# Patient Record
Sex: Male | Born: 1950 | Race: White | Hispanic: No | Marital: Single | Smoking: Former smoker
Health system: Southern US, Community
[De-identification: ages and names within clinical notes are randomized; demographics above are authoritative.]

## PROBLEM LIST (undated history)

## (undated) DIAGNOSIS — I00 Rheumatic fever without heart involvement: Secondary | ICD-10-CM

## (undated) DIAGNOSIS — K5730 Diverticulosis of large intestine without perforation or abscess without bleeding: Secondary | ICD-10-CM

## (undated) DIAGNOSIS — H9190 Unspecified hearing loss, unspecified ear: Secondary | ICD-10-CM

## (undated) DIAGNOSIS — C4491 Basal cell carcinoma of skin, unspecified: Secondary | ICD-10-CM

## (undated) DIAGNOSIS — E785 Hyperlipidemia, unspecified: Secondary | ICD-10-CM

## (undated) DIAGNOSIS — I442 Atrioventricular block, complete: Secondary | ICD-10-CM

## (undated) DIAGNOSIS — I517 Cardiomegaly: Secondary | ICD-10-CM

## (undated) DIAGNOSIS — Z86010 Personal history of colonic polyps: Secondary | ICD-10-CM

## (undated) DIAGNOSIS — Z860101 Personal history of adenomatous and serrated colon polyps: Secondary | ICD-10-CM

## (undated) HISTORY — PX: OTHER SURGICAL HISTORY: SHX169

## (undated) HISTORY — DX: Unspecified hearing loss, unspecified ear: H91.90

## (undated) HISTORY — DX: Hyperlipidemia, unspecified: E78.5

## (undated) HISTORY — DX: Rheumatic fever without heart involvement: I00

## (undated) HISTORY — DX: Cardiomegaly: I51.7

## (undated) HISTORY — DX: Basal cell carcinoma of skin, unspecified: C44.91

## (undated) HISTORY — DX: Atrioventricular block, complete: I44.2

## (undated) HISTORY — DX: Personal history of adenomatous and serrated colon polyps: Z86.0101

## (undated) HISTORY — DX: Diverticulosis of large intestine without perforation or abscess without bleeding: K57.30

## (undated) HISTORY — DX: Personal history of colonic polyps: Z86.010

---

## 2000-08-07 ENCOUNTER — Ambulatory Visit (HOSPITAL_COMMUNITY): Admission: RE | Admit: 2000-08-07 | Discharge: 2000-08-07 | Payer: Self-pay | Admitting: Gastroenterology

## 2000-08-07 ENCOUNTER — Encounter (INDEPENDENT_AMBULATORY_CARE_PROVIDER_SITE_OTHER): Payer: Self-pay | Admitting: Specialist

## 2009-03-23 ENCOUNTER — Encounter: Admission: RE | Admit: 2009-03-23 | Discharge: 2009-03-23 | Payer: Self-pay | Admitting: Gastroenterology

## 2009-09-22 DIAGNOSIS — I517 Cardiomegaly: Secondary | ICD-10-CM

## 2009-09-22 HISTORY — DX: Cardiomegaly: I51.7

## 2010-03-29 ENCOUNTER — Ambulatory Visit: Payer: Self-pay | Admitting: Oncology

## 2010-05-28 ENCOUNTER — Ambulatory Visit: Payer: Self-pay | Admitting: Oncology

## 2010-05-29 LAB — CBC & DIFF AND RETIC
BASO%: 0.7 % (ref 0.0–2.0)
Basophils Absolute: 0 10*3/uL (ref 0.0–0.1)
HCT: 37.4 % — ABNORMAL LOW (ref 38.4–49.9)
HGB: 12.2 g/dL — ABNORMAL LOW (ref 13.0–17.1)
Immature Retic Fract: 7.9 % (ref 0.00–13.40)
MCHC: 32.6 g/dL (ref 32.0–36.0)
MONO#: 0.6 10*3/uL (ref 0.1–0.9)
NEUT#: 3.3 10*3/uL (ref 1.5–6.5)
NEUT%: 56.7 % (ref 39.0–75.0)
WBC: 5.9 10*3/uL (ref 4.0–10.3)
lymph#: 1.8 10*3/uL (ref 0.9–3.3)

## 2010-05-29 LAB — CHCC SMEAR

## 2010-05-31 LAB — PROTEIN ELECTROPHORESIS, SERUM
Alpha-2-Globulin: 10.6 % (ref 7.1–11.8)
Beta 2: 4 % (ref 3.2–6.5)
Beta Globulin: 5.9 % (ref 4.7–7.2)
Gamma Globulin: 11.5 % (ref 11.1–18.8)

## 2010-05-31 LAB — IRON AND TIBC
%SAT: 20 % (ref 20–55)
TIBC: 297 ug/dL (ref 215–435)
UIBC: 239 ug/dL

## 2010-05-31 LAB — COMPREHENSIVE METABOLIC PANEL
ALT: 20 U/L (ref 0–53)
CO2: 27 mEq/L (ref 19–32)
Sodium: 140 mEq/L (ref 135–145)
Total Bilirubin: 0.5 mg/dL (ref 0.3–1.2)
Total Protein: 6.5 g/dL (ref 6.0–8.3)

## 2010-05-31 LAB — FERRITIN: Ferritin: 113 ng/mL (ref 22–322)

## 2010-05-31 LAB — FOLATE: Folate: >20 ng/mL

## 2010-05-31 LAB — HEPATITIS C ANTIBODY: HCV Ab: NEGATIVE

## 2010-05-31 LAB — HEPATITIS B CORE ANTIBODY, IGM: Hep B C IgM: NEGATIVE

## 2010-05-31 LAB — LACTATE DEHYDROGENASE: LDH: 186 U/L (ref 94–250)

## 2010-08-26 ENCOUNTER — Ambulatory Visit: Payer: Self-pay | Admitting: Oncology

## 2010-08-28 LAB — CBC WITH DIFFERENTIAL/PLATELET
Basophils Absolute: 0 10*3/uL (ref 0.0–0.1)
EOS%: 1 % (ref 0.0–7.0)
Eosinophils Absolute: 0.1 10*3/uL (ref 0.0–0.5)
HGB: 13.6 g/dL (ref 13.0–17.1)
NEUT#: 3.1 10*3/uL (ref 1.5–6.5)
RBC: 4.61 10*6/uL (ref 4.20–5.82)
RDW: 15.8 % — ABNORMAL HIGH (ref 11.0–14.6)
lymph#: 1.9 10*3/uL (ref 0.9–3.3)

## 2010-12-09 ENCOUNTER — Encounter (HOSPITAL_BASED_OUTPATIENT_CLINIC_OR_DEPARTMENT_OTHER): Payer: 59 | Admitting: Oncology

## 2010-12-09 ENCOUNTER — Other Ambulatory Visit: Payer: Self-pay | Admitting: Oncology

## 2010-12-09 DIAGNOSIS — E039 Hypothyroidism, unspecified: Secondary | ICD-10-CM

## 2010-12-09 DIAGNOSIS — D649 Anemia, unspecified: Secondary | ICD-10-CM

## 2010-12-09 DIAGNOSIS — Z85828 Personal history of other malignant neoplasm of skin: Secondary | ICD-10-CM

## 2010-12-09 DIAGNOSIS — E785 Hyperlipidemia, unspecified: Secondary | ICD-10-CM

## 2010-12-09 DIAGNOSIS — D696 Thrombocytopenia, unspecified: Secondary | ICD-10-CM

## 2010-12-09 LAB — CBC WITH DIFFERENTIAL/PLATELET
Basophils Absolute: 0 10*3/uL (ref 0.0–0.1)
Eosinophils Absolute: 0 10*3/uL (ref 0.0–0.5)
HGB: 14.1 g/dL (ref 13.0–17.1)
MCV: 89.6 fL (ref 79.3–98.0)
MONO#: 0.4 10*3/uL (ref 0.1–0.9)
MONO%: 8 % (ref 0.0–14.0)
NEUT#: 3.3 10*3/uL (ref 1.5–6.5)
Platelets: 182 10*3/uL (ref 140–400)
RDW: 16 % — ABNORMAL HIGH (ref 11.0–14.6)
WBC: 5.5 10*3/uL (ref 4.0–10.3)

## 2011-02-26 ENCOUNTER — Encounter (HOSPITAL_BASED_OUTPATIENT_CLINIC_OR_DEPARTMENT_OTHER): Payer: 59 | Admitting: Oncology

## 2011-02-26 ENCOUNTER — Other Ambulatory Visit: Payer: Self-pay | Admitting: Medical

## 2011-02-26 DIAGNOSIS — E785 Hyperlipidemia, unspecified: Secondary | ICD-10-CM

## 2011-02-26 DIAGNOSIS — E039 Hypothyroidism, unspecified: Secondary | ICD-10-CM

## 2011-02-26 DIAGNOSIS — Z85828 Personal history of other malignant neoplasm of skin: Secondary | ICD-10-CM

## 2011-02-26 DIAGNOSIS — D649 Anemia, unspecified: Secondary | ICD-10-CM

## 2011-02-26 LAB — CBC WITH DIFFERENTIAL/PLATELET
Basophils Absolute: 0 10*3/uL (ref 0.0–0.1)
Eosinophils Absolute: 0.1 10*3/uL (ref 0.0–0.5)
HCT: 40.4 % (ref 38.4–49.9)
LYMPH%: 30.7 % (ref 14.0–49.0)
MCV: 92.5 fL (ref 79.3–98.0)
MONO#: 0.7 10*3/uL (ref 0.1–0.9)
MONO%: 10.1 % (ref 0.0–14.0)
NEUT#: 3.9 10*3/uL (ref 1.5–6.5)
NEUT%: 57.7 % (ref 39.0–75.0)
Platelets: 198 10*3/uL (ref 140–400)
RBC: 4.37 10*6/uL (ref 4.20–5.82)

## 2011-06-11 ENCOUNTER — Other Ambulatory Visit: Payer: Self-pay | Admitting: Medical

## 2011-06-11 ENCOUNTER — Encounter (HOSPITAL_BASED_OUTPATIENT_CLINIC_OR_DEPARTMENT_OTHER): Payer: 59 | Admitting: Oncology

## 2011-06-11 DIAGNOSIS — E039 Hypothyroidism, unspecified: Secondary | ICD-10-CM

## 2011-06-11 DIAGNOSIS — D649 Anemia, unspecified: Secondary | ICD-10-CM

## 2011-06-11 DIAGNOSIS — Z85828 Personal history of other malignant neoplasm of skin: Secondary | ICD-10-CM

## 2011-06-11 DIAGNOSIS — E785 Hyperlipidemia, unspecified: Secondary | ICD-10-CM

## 2011-06-11 LAB — CBC WITH DIFFERENTIAL/PLATELET
BASO%: 0.6 % (ref 0.0–2.0)
HCT: 44.1 % (ref 38.4–49.9)
LYMPH%: 31.1 % (ref 14.0–49.0)
MCH: 31.7 pg (ref 27.2–33.4)
MCHC: 34.8 g/dL (ref 32.0–36.0)
MCV: 91.2 fL (ref 79.3–98.0)
MONO%: 7.8 % (ref 0.0–14.0)
NEUT%: 59.4 % (ref 39.0–75.0)
Platelets: 160 10*3/uL (ref 140–400)
RBC: 4.84 10*6/uL (ref 4.20–5.82)
WBC: 6.8 10*3/uL (ref 4.0–10.3)

## 2012-07-09 ENCOUNTER — Encounter: Payer: Self-pay | Admitting: Cardiology

## 2012-07-12 ENCOUNTER — Encounter: Payer: Self-pay | Admitting: Internal Medicine

## 2012-07-12 ENCOUNTER — Ambulatory Visit (INDEPENDENT_AMBULATORY_CARE_PROVIDER_SITE_OTHER): Payer: 59 | Admitting: Internal Medicine

## 2012-07-12 VITALS — BP 129/80 | HR 36 | Ht 72.0 in | Wt 184.1 lb

## 2012-07-12 DIAGNOSIS — I498 Other specified cardiac arrhythmias: Secondary | ICD-10-CM

## 2012-07-12 DIAGNOSIS — I442 Atrioventricular block, complete: Secondary | ICD-10-CM

## 2012-07-12 DIAGNOSIS — R001 Bradycardia, unspecified: Secondary | ICD-10-CM

## 2012-07-12 NOTE — Assessment & Plan Note (Signed)
The patient has complete heart block and heart rates in the 30s. This is a class I indication for pacing. This is based on the uncertainty of stability of the escape rate and unrelated to   chronotropic competence which it sounds like he has maintained as well as his exercise capacity.  It is appropriate to consider pacing. We have reviewed benefits and risks. It was appropriate sinus function, pacemaker is pacing would be sufficient and longevity of device would be the criteria in deciding.  The other issue relates to potential causes. It is I think important to exclude sarcoid which is probably best done with cardiac MR we will schedule that. I will review with Dr. Anne Fu if any blood testing has been done for things such as Lyme disease or amyloid.

## 2012-07-12 NOTE — Patient Instructions (Signed)
Your physician has requested that you have a cardiac MRI. Cardiac MRI uses a computer to create images of your heart as its beating, producing both still and moving pictures of your heart and major blood vessels. For further information please visit [email or web address]. Please follow the instruction sheet given to you today for more information.  We will be in touch with you following your Cardiac MRI.

## 2012-07-12 NOTE — Progress Notes (Signed)
ELECTROPHYSIOLOGY CONSULT NOTE  Patient ID: Calvin [name], MRN: [id number], DOB/AGE: Aug 04, 1951 61 y.o. 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 This appears to be asymptomatic. He has chronotropic competence at least partially with a heart rate of 140 on treadmill testing just last week which would be approximately 85% of his predicted maximum. He runs daily. He has only post exercise lightheadedness. He has no shortness of breath.  Cardiac evaluation has included an echocardiogram demonstrating left ventricular hypertrophy. No further testing has been done of which he is aware looking for infiltrative processes in his heart. Past Medical History  Diagnosis Date  . AV block, 1st degree   . AV block, 2nd degree   . Ejection fraction < 50%     60-65%  . LVH (left ventricular hypertrophy) 2011    concentric, on echocardiogram   . Hyperlipidemia   . Rheumatic fever   . High frequency hearing loss   . Skin cancer, basal cell   . Diverticulosis of colon     Left, colonic  . Hx of adenomatous colonic polyps   . Condition not found     Abnormal liver transaminases      Surgical History:  Past Surgical History  Procedure Date  . Basal cell skin cancer surgery   . Arthroscopic knee surgery      Home Meds: Prior to Admission medications   Medication Sig Start Date End Date Taking? Authorizing Provider  B Complex Vitamins (B COMPLEX 100 PO) Take 100 mg by mouth daily.   Yes Historical Provider, MD  levothyroxine (SYNTHROID, LEVOTHROID) 100 MCG tablet Take 100 mcg by mouth daily.   Yes Historical Provider, MD  vitamin E 400 UNIT capsule Take 400 Units by mouth 2 (two) times daily.   Yes Historical Provider, MD     Allergies: No Known  Allergies  History   Social History  . Marital Status: Married    Spouse Name: N/A    Number of Children: N/A  . Years of Education: N/A   Occupational History  . Not on file.   Social History Main Topics  . Smoking status: Former Games developer  . Smokeless tobacco: Not on file  . Alcohol Use:   . Drug Use:   . Sexually Active:    Other Topics Concern  . Not on file   Social History Narrative  . No narrative on file     Family History  Problem Relation Age of Onset  . Bone cancer Mother     Polio  . Hyperlipidemia Father      ROS:  Please see the history of present illness.     All other systems reviewed and negative.    Physical Exam: Blood pressure 129/80, pulse 36, height 6' (1.829 m), weight 184 lb 1.9 oz (83.516 kg). General: Well developed, well nourished male in no acute distress. Head: Normocephalic, atraumatic, sclera non-icteric, no xanthomas, nares are without discharge. Lymph Nodes:  none Back: without scoliosis/kyphosis, no CVA tendersness Neck: Negative for carotid bruits. JVD not elevated. Lungs: Clear bilaterally to auscultation without wheezes, rales, or rhonchi. Breathing is unlabored. Heart: Slow but regular rhythm with variable S1 and normally split S2 an early 2/6 systolic murmur  , rubs, or gallops appreciated. Abdomen:  Soft, non-tender, non-distended with normoactive bowel sounds. No hepatomegaly. No rebound/guarding. No obvious abdominal masses. Msk:  Strength and tone appear normal for age. Extremities: No clubbing or cyanosis. No  edema.  Distal pedal pulses are 2+ and equal bilaterally. Skin: Warm and Dry Neuro: Alert and oriented X 3. CN III-XII intact Grossly normal sensory and motor function . Psych:  Responds to questions appropriately with a normal affect.      Labs: Cardiac Enzymes No results found for this basename: CKTOTAL:4,CKMB:4,TROPONINI:4 in the last 72 hours CBC Lab Results  Component Value Date   WBC 6.8 06/11/2011   HGB  15.3 06/11/2011   HCT 44.1 06/11/2011   MCV 91.2 06/11/2011   PLT 160 06/11/2011   Radiology/Studies:  No results found.  EKG:  Complete heart block with a sinus rate of 65 and a ventricular rate of 36 with a incomplete right bundle branch block configuration Assessment and Plan:   Sherryl Manges

## 2012-07-15 ENCOUNTER — Encounter: Payer: Self-pay | Admitting: Internal Medicine

## 2012-07-23 ENCOUNTER — Encounter: Payer: Self-pay | Admitting: *Deleted

## 2012-07-28 ENCOUNTER — Ambulatory Visit (HOSPITAL_COMMUNITY): Payer: 59

## 2012-08-03 ENCOUNTER — Telehealth: Payer: Self-pay | Admitting: Internal Medicine

## 2012-08-03 ENCOUNTER — Other Ambulatory Visit: Payer: Self-pay | Admitting: Internal Medicine

## 2012-08-03 DIAGNOSIS — R001 Bradycardia, unspecified: Secondary | ICD-10-CM

## 2012-08-03 NOTE — Telephone Encounter (Signed)
Pt is scheduled for a cardiac MRI with and without contrast.  He wants to know what the billing code is so he can talk to his insurance company about costs.  Please call the pt ASAP, he is very anxious about the costs.

## 2012-08-03 NOTE — Telephone Encounter (Signed)
I called patient back and gave him cpt code for MRI 16109.

## 2012-08-03 NOTE — Telephone Encounter (Signed)
New Problem:    Patient called in wanting to know exactly what type of test he was scheduled for on the 08/05/12 for insurance purposes.  Please call back.

## 2012-08-05 ENCOUNTER — Ambulatory Visit (HOSPITAL_COMMUNITY)
Admission: RE | Admit: 2012-08-05 | Discharge: 2012-08-05 | Disposition: A | Discharge status: Home / Self Care | Payer: 59 | Source: Ambulatory Visit | Attending: Internal Medicine | Admitting: Internal Medicine

## 2012-08-05 DIAGNOSIS — R001 Bradycardia, unspecified: Secondary | ICD-10-CM

## 2012-08-05 DIAGNOSIS — I498 Other specified cardiac arrhythmias: Secondary | ICD-10-CM | POA: Insufficient documentation

## 2012-08-05 DIAGNOSIS — I442 Atrioventricular block, complete: Secondary | ICD-10-CM

## 2012-08-05 LAB — CREATININE, SERUM: Creatinine, Ser: 0.9 mg/dL (ref 0.50–1.35)

## 2012-08-05 MED ORDER — GADOBENATE DIMEGLUMINE 529 MG/ML IV SOLN
25.0000 mL | Freq: Once | INTRAVENOUS | Status: AC
  Administered 2012-08-05: 25 mL via INTRAVENOUS

## 2012-09-10 ENCOUNTER — Telehealth: Payer: Self-pay | Admitting: Internal Medicine

## 2012-09-10 NOTE — Telephone Encounter (Signed)
Pt aware all he needs to do is call the [phone] number on his mychart information sheet. Pt still has his copy. Mylo Red RN

## 2012-09-10 NOTE — Telephone Encounter (Signed)
Pt's mychart activation code expired, needs a new one

## 2012-10-01 ENCOUNTER — Encounter: Payer: Self-pay | Admitting: Internal Medicine

## 2012-11-06 ENCOUNTER — Other Ambulatory Visit: Payer: Self-pay

## 2013-05-30 ENCOUNTER — Telehealth: Payer: Self-pay | Admitting: Internal Medicine

## 2013-06-08 NOTE — Telephone Encounter (Signed)
Left message on pt personal answering machine - ok to have colonoscopy per Dr. Graciela Husbands.

## 2013-07-28 ENCOUNTER — Other Ambulatory Visit: Payer: Self-pay

## 2015-05-02 ENCOUNTER — Ambulatory Visit (INDEPENDENT_AMBULATORY_CARE_PROVIDER_SITE_OTHER): Payer: 59 | Admitting: Nurse Practitioner

## 2015-05-02 ENCOUNTER — Encounter: Payer: Self-pay | Admitting: Nurse Practitioner

## 2015-05-02 VITALS — BP 160/60 | HR 30 | Ht 72.0 in | Wt 195.0 lb

## 2015-05-02 DIAGNOSIS — I459 Conduction disorder, unspecified: Secondary | ICD-10-CM | POA: Diagnosis not present

## 2015-05-02 NOTE — Progress Notes (Signed)
Electrophysiology Office Note Date: 05/02/2015  ID:  Calvin [name], DOB Mar 17, 1951, MRN [id number]  PCP: Gennette Pac, MD Electrophysiologist: Caryl Comes  CC: follow up on bradycardia  Calvin [name] is a 64 y.o. male seen today for Dr Caryl Comes.  He has not been seen since 2013.  At that time, he was evaluated for heart block that was asymptomatic.  Cardiac MRI at that time demonstrated normal LV size and function, mild RAE, normal RV anatomy, no evidence of sarcoid or infiltrative cardiomyopathy.  Since last being seen in our clinic, the patient reports doing very well.  He denies chest pain, palpitations, dyspnea, PND, orthopnea, nausea, vomiting, dizziness, syncope, edema, weight gain, or early satiety. He remains very active playing tennis without functional limitations.   Past Medical History  Diagnosis Date  . Atrioventricular block, complete   . Ejection fraction < 50%     60-65%  . LVH (left ventricular hypertrophy) 2011    concentric, on echocardiogram   . Hyperlipidemia   . Rheumatic fever   . High frequency hearing loss   . Skin cancer, basal cell   . Diverticulosis of colon     Left, colonic  . Hx of adenomatous colonic polyps    Past Surgical History  Procedure Laterality Date  . Basal cell skin cancer surgery    . Arthroscopic knee surgery      Current Outpatient Prescriptions  Medication Sig Dispense Refill  . B Complex Vitamins (B COMPLEX 100 PO) Take 100 mg by mouth daily.    Marland Kitchen levothyroxine (SYNTHROID, LEVOTHROID) 100 MCG tablet Take 100 mcg by mouth daily.    . Omega-3 Fatty Acids (FISH OIL) 1000 MG CAPS Take 1,000 mg by mouth daily.    . Turmeric 500 MG CAPS Take 500 mg by mouth 2 (two) times daily.    . Vitamin D, Ergocalciferol, (DRISDOL) 50000 UNITS CAPS capsule Take 50,000 Units by mouth every 7 (seven) days.    . vitamin E 400 UNIT capsule Take 400 Units by mouth 2 (two) times daily.     No current facility-administered medications for this  visit.    Allergies:   Review of patient's allergies indicates no known allergies.   Social History: Social History   Social History  . Marital Status: Married    Spouse Name: N/A  . Number of Children: N/A  . Years of Education: N/A   Occupational History  . Not on file.   Social History Main Topics  . Smoking status: Former Research scientist (life sciences)  . Smokeless tobacco: Not on file  . Alcohol Use: Not on file  . Drug Use: Not on file  . Sexual Activity: Not on file   Other Topics Concern  . Not on file   Social History Narrative    Family History: Family History  Problem Relation Age of Onset  . Bone cancer Mother     Polio  . Hyperlipidemia Father     Review of Systems: All other systems reviewed and are otherwise negative except as noted above.   Physical Exam: VS:  BP 160/60 mmHg  Pulse 30  Ht 6' (1.829 m)  Wt 195 lb (88.451 kg)  BMI 26.44 kg/m2  SpO2 99% , BMI Body mass index is 26.44 kg/(m^2). Wt Readings from Last 3 Encounters:  05/02/15 195 lb (88.451 kg)  07/12/12 184 lb 1.9 oz (83.516 kg)    GEN- The patient is well appearing, alert and oriented x 3 today.   HEENT: normocephalic, atraumatic; sclera  clear, conjunctiva pink; hearing intact; oropharynx clear; neck supple, no JVP Lymph- no cervical lymphadenopathy Lungs- Clear to ausculation bilaterally, normal work of breathing.  No wheezes, rales, rhonchi Heart- Bradycardic regular rate and rhythm  GI- soft, non-tender, non-distended, bowel sounds present  Extremities- no clubbing, cyanosis, or edema; DP/PT/radial pulses 2+ bilaterally MS- no significant deformity or atrophy Skin- warm and dry, no rash or lesion  Psych- euthymic mood, full affect Neuro- strength and sensation are intact   EKG:  EKG is ordered today. The ekg ordered today shows sinus bradycardia with complete heart block, ventricular rate 30  Recent Labs: No results found for requested labs within last 365 days.    Other studies  Reviewed: Additional studies/ records that were reviewed today include:Dr Klein's office notes, cardiac MRI  Assessment and Plan: 1.  Complete heart block The patient has longstanding heart block for which he remains asymptomatic. He also remains reluctant to pursue pacing even though he has a class 1 indication.  I discussed this with the patient today.  He will continue to monitor for symptoms. I have advised that he should call the office/seek medical attention for any dizziness/pre-syncope/syncope/exercise intolerance.  He is aware and agrees with plan.  2.  Elevated blood pressure BP elevated in office today Recheck by me 142/82 Home records reflect systolic blood pressures in the 120's. No changes made today. If anti-hypertensives are needed in the future, would avoid AVN blocking agents.   Current medicines are reviewed at length with the patient today.   The patient does not have concerns regarding his medicines.  The following changes were made today:  none  Labs/ tests ordered today include:  Orders Placed This Encounter  Procedures  . EKG 12-Lead     Disposition:   Follow up with Dr Caryl Comes in 6 months     Signed, Chanetta Marshall, NP 05/02/2015 3:47 PM   [facility] [address] [place] [address] [phone] (office) [phone] (fax)

## 2015-05-02 NOTE — Patient Instructions (Signed)
Medication Instructions:  Your physician recommends that you continue on your current medications as directed. Please refer to the Current Medication list given to you today.  Labwork: None ordered  Testing/Procedures: None ordered  Follow-Up: Your physician wants you to follow-up in: 6 months with Dr. Caryl Comes. You will receive a reminder letter in the mail two months in advance. If you don't receive a letter, please call our office to schedule the follow-up appointment.  Any Other Special Instructions Will Be Listed Below (If Applicable). Thank you for choosing [facility]!!

## 2015-10-26 ENCOUNTER — Telehealth: Payer: Self-pay | Admitting: Internal Medicine

## 2015-10-26 NOTE — Telephone Encounter (Signed)
Rescheduled appointment from [place] in March to MD in April

## 2015-10-26 NOTE — Telephone Encounter (Signed)
Left Voice Message To Call Back

## 2015-10-26 NOTE — Telephone Encounter (Signed)
New Message  Pt has recall sched w/ Amber for 11/21/15- pt wanted to speak w/ RN to see if he should wait to see Dr Caryl Comes in April or come to appt w/ Amber in March. Please call back and discuss.

## 2015-11-21 ENCOUNTER — Ambulatory Visit: Payer: 59 | Admitting: Nurse Practitioner

## 2016-01-01 ENCOUNTER — Encounter: Payer: Self-pay | Admitting: Internal Medicine

## 2016-01-01 ENCOUNTER — Ambulatory Visit (INDEPENDENT_AMBULATORY_CARE_PROVIDER_SITE_OTHER): Payer: 59 | Admitting: Internal Medicine

## 2016-01-01 VITALS — BP 128/68 | HR 37 | Ht 72.0 in | Wt 192.2 lb

## 2016-01-01 DIAGNOSIS — R1900 Intra-abdominal and pelvic swelling, mass and lump, unspecified site: Secondary | ICD-10-CM | POA: Diagnosis not present

## 2016-01-01 DIAGNOSIS — I442 Atrioventricular block, complete: Secondary | ICD-10-CM | POA: Diagnosis not present

## 2016-01-01 NOTE — Patient Instructions (Signed)
Medication Instructions: - Your physician recommends that you continue on your current medications as directed. Please refer to the Current Medication list given to you today.  Labwork: - none  Procedures/Testing: - Your physician has requested that you have an abdominal aorta duplex. During this test, an ultrasound is used to evaluate the aorta. Allow 30 minutes for this exam. Do not eat after midnight the day before and avoid carbonated beverages  Follow-Up: - Your physician wants you to follow-up in: 6 months with Chanetta Marshall, NP for Dr. Caryl Comes & 1 year with Dr. Caryl Comes. You will receive a reminder letter in the mail two months in advance. If you don't receive a letter, please call our office to schedule the follow-up appointment.  Any Additional Special Instructions Will Be Listed Below (If Applicable).     If you need a refill on your cardiac medications before your next appointment, please call your pharmacy.

## 2016-01-01 NOTE — Progress Notes (Signed)
      Patient Care Team: Hulan Fess, MD as PCP - General (Family Medicine)   HPI  Calvin [name] is a 65 y.o. male Seen in followup for asymptomatic complete heart block with LVH   The patient denies chest pain, shortness of breath, nocturnal dyspnea, orthopnea or peripheral edema.  There have been no palpitations, lightheadedness or syncope.   no family hx of bradycardia although 64 yo father recently received pacemaker   Records and Results  Office notes  Past Medical History  Diagnosis Date  . Atrioventricular block, complete ([place])   . Ejection fraction < 50%     60-65%  . LVH (left ventricular hypertrophy) 2011    concentric, on echocardiogram   . Hyperlipidemia   . Rheumatic fever   . High frequency hearing loss   . Skin cancer, basal cell   . Diverticulosis of colon     Left, colonic  . Hx of adenomatous colonic polyps     Past Surgical History  Procedure Laterality Date  . Basal cell skin cancer surgery    . Arthroscopic knee surgery      Current Outpatient Prescriptions  Medication Sig Dispense Refill  . B Complex Vitamins (B COMPLEX 100 PO) Take 100 mg by mouth daily.    Marland Kitchen levothyroxine (SYNTHROID, LEVOTHROID) 100 MCG tablet Take 100 mcg by mouth daily.    . Omega-3 Fatty Acids (FISH OIL) 1000 MG CAPS Take 1,000 mg by mouth daily.    . Turmeric 500 MG CAPS Take 500 mg by mouth 2 (two) times daily.     No current facility-administered medications for this visit.    No Known Allergies    Review of Systems negative except from HPI and PMH  Physical Exam BP 128/68 mmHg  Pulse 37  Ht 6' (1.829 m)  Wt 192 lb 4 oz (87.204 kg)  BMI 26.07 kg/m2 Well developed and well nourished in no acute distress HENT normal E scleral and icterus clear Neck Supple JVP flat; carotids brisk and full Clear to ausculation  slow but Regular rate and rhythm, 2/6 systolic murmur Soft with active bowel sounds Broadly palplable pulsation  No clubbing cyanosis   Edema Alert and oriented, grossly normal motor and sensory function Skin Warm and Dry  Sinus 65 w complelte heart block Narrow QRS excape @ 37 -/10/45  Rsr'  Assessment and  Plan Complete heart block--narrow QRS escape  Abdominal pulsating mass  No symtpms attributable to CHB  Reviewed physiology and the assoc with Narrow QRS  Will do Abd ultarasound for mass

## 2016-01-02 ENCOUNTER — Ambulatory Visit (HOSPITAL_COMMUNITY)
Admission: RE | Admit: 2016-01-02 | Discharge: 2016-01-02 | Disposition: A | Discharge status: Home / Self Care | Payer: 59 | Source: Ambulatory Visit | Attending: Cardiology | Admitting: Cardiology

## 2016-01-02 ENCOUNTER — Other Ambulatory Visit: Payer: Self-pay | Admitting: Internal Medicine

## 2016-01-02 DIAGNOSIS — E785 Hyperlipidemia, unspecified: Secondary | ICD-10-CM | POA: Diagnosis not present

## 2016-01-02 DIAGNOSIS — I723 Aneurysm of iliac artery: Secondary | ICD-10-CM

## 2016-01-02 DIAGNOSIS — R1900 Intra-abdominal and pelvic swelling, mass and lump, unspecified site: Secondary | ICD-10-CM

## 2016-04-14 ENCOUNTER — Telehealth: Payer: Self-pay | Admitting: Internal Medicine

## 2016-04-14 DIAGNOSIS — I517 Cardiomegaly: Secondary | ICD-10-CM

## 2016-04-14 NOTE — Telephone Encounter (Signed)
New message  Pt wants to have an ultrasound scan performed. There in no order in the computer. Is it time for him to have one. Please advise.

## 2016-04-14 NOTE — Telephone Encounter (Signed)
Call back from the triage nurse at  Dr. Eddie Dibbles office. Per Dr. Eddie Dibbles note- the patient had a history of LVH and should discuss a repeat echo with Dr. Caryl Comes.  Will forward to Dr. Caryl Comes to review when back in clinic next week.

## 2016-04-14 NOTE — Telephone Encounter (Signed)
I called and spoke with the patient. He states that he recently saw Dr. Rex Kras and that he recommended that he have some type of ultrasound study done. He was not sure of what type of study. I advised him I would call Dr. Eddie Dibbles office and see if I could find out what the recommendation is and call him back. I advised he may not get a call back until next week as Dr. Caryl Comes is out of the office and will need to give approval. He is agreeable.  I left a message at Dr. Eddie Dibbles office to please clarify what type of ultrasound he is recommending.

## 2016-04-21 NOTE — Telephone Encounter (Signed)
Reasonable to repeat the echo

## 2016-04-22 NOTE — Telephone Encounter (Signed)
I left a message for the patient to call. 

## 2016-04-24 NOTE — Telephone Encounter (Signed)
Late entry- I spoke with the patient on 04/22/16. He is aware that Dr. Caryl [name] has ok'ed [place] to order an echocardiogram on him to f/u LVH. He  Is agreeable. Will place order.  The patient is aware scheduling will call him to arrange.

## 2016-05-13 ENCOUNTER — Other Ambulatory Visit: Payer: Self-pay

## 2016-05-13 ENCOUNTER — Ambulatory Visit (HOSPITAL_COMMUNITY): Payer: 59 | Attending: Cardiology

## 2016-05-13 DIAGNOSIS — I517 Cardiomegaly: Secondary | ICD-10-CM | POA: Diagnosis not present

## 2016-05-13 DIAGNOSIS — I340 Nonrheumatic mitral (valve) insufficiency: Secondary | ICD-10-CM | POA: Insufficient documentation

## 2016-05-13 DIAGNOSIS — I442 Atrioventricular block, complete: Secondary | ICD-10-CM | POA: Diagnosis not present

## 2016-05-13 DIAGNOSIS — I77810 Thoracic aortic ectasia: Secondary | ICD-10-CM | POA: Diagnosis not present

## 2016-05-13 DIAGNOSIS — E785 Hyperlipidemia, unspecified: Secondary | ICD-10-CM | POA: Insufficient documentation

## 2016-06-13 ENCOUNTER — Encounter: Payer: Self-pay | Admitting: Nurse Practitioner

## 2016-07-01 ENCOUNTER — Encounter: Payer: Self-pay | Admitting: Nurse Practitioner

## 2016-07-01 NOTE — Progress Notes (Signed)
Electrophysiology Office Note Date: 07/02/2016  ID:  Calvin [name], DOB [birth year], MRN [id number]  PCP: Gennette Pac, MD Electrophysiologist: Caryl Comes  CC: follow up complete heart block  Calvin [name] is a 65 y.o. male seen today for Dr Caryl Comes.  He presents today for routine electrophysiology followup.  Since last being seen in our clinic, the patient reports doing very well.  He remains active running and playing tennis and is able to generate a heart rate of around 120 with activity.  He denies chest pain, palpitations, dyspnea, PND, orthopnea, nausea, vomiting, dizziness, syncope, edema, weight gain, or early satiety.  Past Medical History:  Diagnosis Date  . Atrioventricular block, complete ([place])   . Diverticulosis of colon    Left, colonic  . High frequency hearing loss   . Hx of adenomatous colonic polyps   . Hyperlipidemia   . LVH (left ventricular hypertrophy) 2011   concentric, on echocardiogram   . Rheumatic fever   . Skin cancer, basal cell    Past Surgical History:  Procedure Laterality Date  . Arthroscopic knee surgery    . Basal cell skin cancer surgery      Current Outpatient Prescriptions  Medication Sig Dispense Refill  . B Complex Vitamins (B COMPLEX 100 PO) Take 100 mg by mouth daily.    Marland Kitchen levothyroxine (SYNTHROID, LEVOTHROID) 112 MCG tablet Take 112 mcg by mouth daily.    . Omega-3 Fatty Acids (FISH OIL) 1000 MG CAPS Take 1,000 mg by mouth daily.    . Turmeric 500 MG CAPS Take 500 mg by mouth 2 (two) times daily.    . Vitamin D, Ergocalciferol, (DRISDOL) 50000 units CAPS capsule Take 50,000 Units by mouth once a week.  0   No current facility-administered medications for this visit.     Allergies:   Review of patient's allergies indicates no known allergies.   Social History: Social History   Social History  . Marital status: Married    Spouse name: N/A  . Number of children: N/A  . Years of education: N/A   Occupational History    . Not on file.   Social History Main Topics  . Smoking status: Former Research scientist (life sciences)  . Smokeless tobacco: Never Used  . Alcohol use No  . Drug use: No  . Sexual activity: Not on file   Other Topics Concern  . Not on file   Social History Narrative  . No narrative on file    Family History: Family History  Problem Relation Age of Onset  . Bone cancer Mother     Polio  . Hyperlipidemia Father     Review of Systems: All other systems reviewed and are otherwise negative except as noted above.   Physical Exam: VS:  BP 130/80   Pulse (!) 36   Ht 6\' 1"  (1.854 m)   Wt 188 lb 6.4 oz (85.5 kg)   BMI 24.86 kg/m  , BMI Body mass index is 24.86 kg/m. Wt Readings from Last 3 Encounters:  07/02/16 188 lb 6.4 oz (85.5 kg)  01/01/16 192 lb 4 oz (87.2 kg)  05/02/15 195 lb (88.5 kg)    GEN- The patient is well appearing, alert and oriented x 3 today.   HEENT: normocephalic, atraumatic; sclera clear, conjunctiva pink; hearing intact; oropharynx clear; neck supple Lungs- Clear to ausculation bilaterally, normal work of breathing.  No wheezes, rales, rhonchi Heart- Bradycardic regular rate and rhythm, no murmurs, rubs or gallops GI- soft, non-tender, non-distended, bowel sounds  present Extremities- no clubbing, cyanosis, or edema MS- no significant deformity or atrophy Skin- warm and dry, no rash or lesion  Psych- euthymic mood, full affect Neuro- strength and sensation are intact   EKG:  EKG is ordered today. The ekg ordered today shows complete heart block with narrow escape, ventricular rate 32  Recent Labs: No results found for requested labs within last 8760 hours.   Echo 04/2016 demonstrated EF 55-60%, mild focal hypertrophy of septum, no RWMA, grade 1 diastolic dysfunction.   Other studies Reviewed: Additional studies/ records that were reviewed today include: Dr Olin Pia office notes  Assessment and Plan: 1.  Complete heart block Longstanding and asymptomatic with narrow  QRS Avoid AVN blocking agents He is clear that he would like to avoid pacemaker Discussed symptoms of complete heart block that he should notify our office for  2.  AAA Stable by ultrasound 12/2015 Repeat 12/2016   Current medicines are reviewed at length with the patient today.   The patient does not have concerns regarding his medicines.  The following changes were made today:  none  Labs/ tests ordered today include: none No orders of the defined types were placed in this encounter.    Disposition:   Follow up with Dr Caryl Comes in 6 months as scheduled    Signed, Chanetta Marshall, NP 07/02/2016 8:24 AM   [place] [address] [phone] (office) [phone] (fax)

## 2016-07-02 ENCOUNTER — Ambulatory Visit (INDEPENDENT_AMBULATORY_CARE_PROVIDER_SITE_OTHER): Payer: 59 | Admitting: Nurse Practitioner

## 2016-07-02 ENCOUNTER — Encounter: Payer: Self-pay | Admitting: Nurse Practitioner

## 2016-07-02 VITALS — BP 130/80 | HR 36 | Ht 73.0 in | Wt 188.4 lb

## 2016-07-02 DIAGNOSIS — I714 Abdominal aortic aneurysm, without rupture, unspecified: Secondary | ICD-10-CM

## 2016-07-02 DIAGNOSIS — I442 Atrioventricular block, complete: Secondary | ICD-10-CM

## 2016-07-02 NOTE — Patient Instructions (Addendum)
Medication Instructions:   Your physician recommends that you continue on your current medications as directed. Please refer to the Current Medication list given to you today.    If you need a refill on your cardiac medications before your next appointment, please call your pharmacy.  Labwork: NONE ORDER TODAY    Testing/Procedures: NONE ORDER TODAY    Follow-Up: Your physician wants you to follow-up in:  [place]  [place] will receive a reminder letter in the mail two months in advance. If you don't receive a letter, please call our office to schedule the follow-up appointment.      Any Other Special Instructions Will Be Listed Below (If Applicable).

## 2016-12-22 ENCOUNTER — Encounter: Payer: Self-pay | Admitting: Internal Medicine

## 2017-01-05 ENCOUNTER — Encounter: Payer: Self-pay | Admitting: Internal Medicine

## 2017-01-05 ENCOUNTER — Ambulatory Visit (INDEPENDENT_AMBULATORY_CARE_PROVIDER_SITE_OTHER): Payer: 59 | Admitting: Internal Medicine

## 2017-01-05 VITALS — BP 118/70 | HR 37 | Ht 73.0 in | Wt 197.0 lb

## 2017-01-05 DIAGNOSIS — I442 Atrioventricular block, complete: Secondary | ICD-10-CM

## 2017-01-05 NOTE — Progress Notes (Signed)
      Patient Care Team: Hulan Fess, MD as PCP - General (Family Medicine)   HPI  Calvin [name] is a 66 y.o. male Seen in followup for asymptomatic complete heart block with LVH   The patient denies chest pain, shortness of breath, nocturnal dyspnea, orthopnea or peripheral edema.  There have been no palpitations, lightheadedness or syncope.   Changes he has had no significant symptoms although his exercise has tapered off significantly which he ascribes to work and having gotten sick over the Christmas season  He wears a FitBit and his heart rates get into the 110-120 range with exercise  His father received a pacemaker recently    Records and Results  Office notes  Past Medical History:  Diagnosis Date  . Atrioventricular block, complete ([place])   . Diverticulosis of colon    Left, colonic  . High frequency hearing loss   . Hx of adenomatous colonic polyps   . Hyperlipidemia   . LVH (left ventricular hypertrophy) 2011   concentric, on echocardiogram   . Rheumatic fever   . Skin cancer, basal cell     Past Surgical History:  Procedure Laterality Date  . Arthroscopic knee surgery    . Basal cell skin cancer surgery      Current Outpatient Prescriptions  Medication Sig Dispense Refill  . B Complex Vitamins (B COMPLEX 100 PO) Take 100 mg by mouth daily.    . Cholecalciferol (VITAMIN D PO) Take 10,000 Units by mouth daily.    Marland Kitchen levothyroxine (SYNTHROID, LEVOTHROID) 112 MCG tablet Take 112 mcg by mouth daily.    . Omega-3 Fatty Acids (FISH OIL) 1000 MG CAPS Take 1,000 mg by mouth daily.    . Turmeric 500 MG CAPS Take 500 mg by mouth 2 (two) times daily.     No current facility-administered medications for this visit.     No Known Allergies    Review of Systems negative except from HPI and PMH  Physical Exam BP 118/70   Pulse (!) 37   Ht 6\' 1"  (1.854 m)   Wt 197 lb (89.4 kg)   SpO2 98%   BMI 25.99 kg/m  Well developed and well nourished in no acute  distress HENT normal E scleral and icterus clear Neck Supple JVP flat; carotids brisk and full Clear to ausculation  slow but Regular rate and rhythm, 2/6 systolic murmur Soft with active bowel sounds Broadly palplable pulsation  No clubbing cyanosis  Edema Alert and oriented, grossly normal motor and sensory function Skin Warm and Dry  Sinus 65 w complete heart block Narrow QRS excape @ 36 -/10/57    Assessment and  Plan Complete heart block--narrow QRS escape     No symptoms attributable to CHB  Reviewed physiology and the assoc with Narrow QRS  We'll continue to monitor his heart rate with his FitBit and to alert [place] there any symptoms of lightheadedness

## 2017-01-05 NOTE — Patient Instructions (Signed)

## 2017-01-09 ENCOUNTER — Ambulatory Visit: Payer: 59 | Admitting: Internal Medicine

## 2017-07-01 ENCOUNTER — Telehealth: Payer: Self-pay | Admitting: Internal Medicine

## 2017-07-01 NOTE — Telephone Encounter (Signed)
NEw MEssage  Pt call requesting to speak with RN about his diagnosis. Pt states he has a couple of questions to ask the RN. Please call back to discuss

## 2017-07-01 NOTE — Telephone Encounter (Signed)
Pt called re recent hospital visit which notes 3rd degree complete block Pt aware this was noted on previous EKG  not a new finding  Pt was relieved /cy

## 2018-01-13 ENCOUNTER — Encounter: Payer: Self-pay | Admitting: Internal Medicine

## 2018-01-13 ENCOUNTER — Encounter (INDEPENDENT_AMBULATORY_CARE_PROVIDER_SITE_OTHER): Payer: Self-pay

## 2018-01-13 ENCOUNTER — Ambulatory Visit: Payer: 59 | Admitting: Internal Medicine

## 2018-01-13 VITALS — BP 126/80 | HR 33 | Ht 73.0 in | Wt 199.0 lb

## 2018-01-13 DIAGNOSIS — I442 Atrioventricular block, complete: Secondary | ICD-10-CM

## 2018-01-13 DIAGNOSIS — I517 Cardiomegaly: Secondary | ICD-10-CM | POA: Diagnosis not present

## 2018-01-13 NOTE — Patient Instructions (Signed)

## 2018-01-13 NOTE — Progress Notes (Signed)
      Patient Care Team: Hulan Fess, MD as PCP - General (Family Medicine)   HPI  Calvin [name] is a 67 y.o. male Seen in followup for asymptomatic complete heart block with LVH   He has developed a bells palsy which has impacted his exercise as he has lost balance with visual consequences  HR up to 70's but only playing tennis;; last year HR were  into the 110-120 range   With running    No lightheadedness or dizziness      Records and Results    Past Medical History:  Diagnosis Date  . Atrioventricular block, complete ([place])   . Diverticulosis of colon    Left, colonic  . High frequency hearing loss   . Hx of adenomatous colonic polyps   . Hyperlipidemia   . LVH (left ventricular hypertrophy) 2011   concentric, on echocardiogram   . Rheumatic fever   . Skin cancer, basal cell     Past Surgical History:  Procedure Laterality Date  . Arthroscopic knee surgery    . Basal cell skin cancer surgery      Current Outpatient Medications  Medication Sig Dispense Refill  . B Complex Vitamins (B COMPLEX 100 PO) Take 100 mg by mouth daily.    . Cholecalciferol (VITAMIN D PO) Take 10,000 Units by mouth daily.    Marland Kitchen levothyroxine (SYNTHROID, LEVOTHROID) 112 MCG tablet Take 112 mcg by mouth daily.    . Omega-3 Fatty Acids (FISH OIL) 1000 MG CAPS Take 1,000 mg by mouth daily.    . Turmeric 500 MG CAPS Take 500 mg by mouth 2 (two) times daily.     No current facility-administered medications for this visit.     No Known Allergies    Review of Systems negative except from HPI and PMH  Physical Exam BP 126/80   Pulse (!) 33   Ht 6\' 1"  (1.854 m)   Wt 199 lb (90.3 kg)   SpO2 98%   BMI 26.25 kg/m  Well developed and nourished in no acute distress HENT normal Neck supple with JVP-flat Clear Regular rate and rhythm, no murmurs or gallops Abd-soft with active BS No Clubbing cyanosis edema Skin-warm and dry A & Oriented  Grossly normal sensory and motor  function    Sinus 55 w 2 :1 heart block Narrow QRS escape at 34    Assessment and  Plan  Complete heart block--narrow QRS escape  Bells Palsy    The patient has had no clearly attributable symptoms to his heart block.  Interestingly, he is in 2: 1 block today as opposed to complete heart block.  I have encouraged him to be very attentive to any lightheadedness dizziness or exercise intolerance.  At 39, a pacemaker battery likely has a predicted longevity longer than his life.  He is optimistic about getting back to running now that his Bell's palsy is getting better and he can see better

## 2018-12-02 ENCOUNTER — Other Ambulatory Visit: Payer: Self-pay | Admitting: Gastroenterology

## 2018-12-28 ENCOUNTER — Ambulatory Visit (HOSPITAL_COMMUNITY): Admission: RE | Admit: 2018-12-28 | Payer: 59 | Source: Home / Self Care | Admitting: Gastroenterology

## 2018-12-28 ENCOUNTER — Encounter (HOSPITAL_COMMUNITY): Admission: RE | Payer: Self-pay | Source: Home / Self Care

## 2018-12-28 SURGERY — COLONOSCOPY WITH PROPOFOL
Anesthesia: Monitor Anesthesia Care

## 2019-01-25 ENCOUNTER — Telehealth: Payer: Self-pay

## 2019-01-25 NOTE — Telephone Encounter (Signed)
I called and spoke with patient, he is ok with doing a video visit.     Virtual Visit Pre-Appointment Phone Call  "(Name), I am calling you today to discuss your upcoming appointment. We are currently trying to limit exposure to the virus that causes COVID-19 by seeing patients at home rather than in the office."  1. "What is the BEST phone number to call the day of the visit?" - include this in appointment notes  2. "Do you have or have access to (through a family member/friend) a smartphone with video capability that we can use for your visit?" a. If yes - list this number in appt notes as "cell" (if different from BEST phone #) and list the appointment type as a VIDEO visit in appointment notes b. If no - list the appointment type as a PHONE visit in appointment notes  3. Confirm consent - "In the setting of the current Covid19 crisis, you are scheduled for a (phone or video) visit with your provider on (date) at (time).  Just as we do with many in-office visits, in order for you to participate in this visit, we must obtain consent.  If you'd like, I can send this to your mychart (if signed up) or email for you to review.  Otherwise, I can obtain your verbal consent now.  All virtual visits are billed to your insurance company just like a normal visit would be.  By agreeing to a virtual visit, we'd like you to understand that the technology does not allow for your provider to perform an examination, and thus may limit your provider's ability to fully assess your condition. If your provider identifies any concerns that need to be evaluated in person, we will make arrangements to do so.  Finally, though the technology is pretty good, we cannot assure that it will always work on either your or our end, and in the setting of a video visit, we may have to convert it to a phone-only visit.  In either situation, we cannot ensure that we have a secure connection.  Are you willing to proceed?" STAFF: Did  the patient verbally acknowledge consent to telehealth visit? Document YES/NO here: YES  4. Advise patient to be prepared - "Two hours prior to your appointment, go ahead and check your blood pressure, pulse, oxygen saturation, and your weight (if you have the equipment to check those) and write them all down. When your visit starts, your provider will ask you for this information. If you have an Apple Watch or Kardia device, please plan to have heart rate information ready on the day of your appointment. Please have a pen and paper handy nearby the day of the visit as well."  5. Give patient instructions for MyChart download to smartphone OR Doximity/Doxy.me as below if video visit (depending on what platform provider is using)  6. Inform patient they will receive a phone call 15 minutes prior to their appointment time (may be from unknown caller ID) so they should be prepared to answer    TELEPHONE CALL NOTE  Calvin [name] has been deemed a candidate for a follow-up tele-health visit to limit community exposure during the Covid-19 pandemic. I spoke with the patient via phone to ensure availability of phone/video source, confirm preferred email & phone number, and discuss instructions and expectations.  I reminded Calvin [name] to be prepared with any vital sign and/or heart rhythm information that could potentially be obtained via home monitoring, at the time of  his visit. I reminded Calvin [name] to expect a phone call prior to his visit.  Mady Haagensen, [place] 01/25/2019 5:07 PM   INSTRUCTIONS FOR DOWNLOADING THE MYCHART APP TO SMARTPHONE  - The patient must first make sure to have activated MyChart and know their login information - If Apple, go to [employer] and type in MyChart in the search bar and download the app. If Android, ask patient to go to [employer] and type in [place] in the search bar and download the app. The app is free but as with any other app downloads, their  phone may require them to verify saved payment information or Apple/Android password.  - The patient will need to then log into the app with their MyChart username and password, and select [facility] as their healthcare provider to link the account. When it is time for your visit, go to the MyChart app, find appointments, and click Begin Video Visit. Be sure to Select Allow for your device to access the Microphone and Camera for your visit. You will then be connected, and your provider will be with you shortly.  **If they have any issues connecting, or need assistance please contact MyChart service desk [phone])**  **If using a computer, in order to ensure the best quality for their visit they will need to use either of the following Internet Browsers: [employer], or Google Chrome**  IF USING DOXIMITY or DOXY.ME - The patient will receive a link just prior to their visit by text.     FULL LENGTH CONSENT FOR TELE-HEALTH VISIT   I hereby voluntarily request, consent and authorize [facility] and its employed or contracted physicians, physician assistants, nurse practitioners or other licensed health care professionals (the Practitioner), to provide me with telemedicine health care services (the "Services") as deemed necessary by the treating Practitioner. I acknowledge and consent to receive the Services by the Practitioner via telemedicine. I understand that the telemedicine visit will involve communicating with the Practitioner through live audiovisual communication technology and the disclosure of certain medical information by electronic transmission. I acknowledge that I have been given the opportunity to request an in-person assessment or other available alternative prior to the telemedicine visit and am voluntarily participating in the telemedicine visit.  I understand that I have the right to withhold or withdraw my consent to the use of telemedicine in the course of  my care at any time, without affecting my right to future care or treatment, and that the Practitioner or I may terminate the telemedicine visit at any time. I understand that I have the right to inspect all information obtained and/or recorded in the course of the telemedicine visit and may receive copies of available information for a reasonable fee.  I understand that some of the potential risks of receiving the Services via telemedicine include:  Marland Kitchen Delay or interruption in medical evaluation due to technological equipment failure or disruption; . Information transmitted may not be sufficient (e.g. poor resolution of images) to allow for appropriate medical decision making by the Practitioner; and/or  . In rare instances, security protocols could fail, causing a breach of personal health information.  Furthermore, I acknowledge that it is my responsibility to provide information about my medical history, conditions and care that is complete and accurate to the best of my ability. I acknowledge that Practitioner's advice, recommendations, and/or decision may be based on factors not within their control, such as incomplete or inaccurate data provided by me  or distortions of diagnostic images or specimens that may result from electronic transmissions. I understand that the practice of medicine is not an exact science and that Practitioner makes no warranties or guarantees regarding treatment outcomes. I acknowledge that I will receive a copy of this consent concurrently upon execution via email to the email address I last provided but may also request a printed copy by calling the office of [facility].    I understand that my insurance will be billed for this visit.   I have read or had this consent read to me. . I understand the contents of this consent, which adequately explains the benefits and risks of the Services being provided via telemedicine.  . I have been provided ample opportunity to ask  questions regarding this consent and the Services and have had my questions answered to my satisfaction. . I give my informed consent for the services to be provided through the use of telemedicine in my medical care  By participating in this telemedicine visit I agree to the above.

## 2019-01-31 ENCOUNTER — Encounter: Payer: Self-pay | Admitting: Internal Medicine

## 2019-01-31 ENCOUNTER — Telehealth: Payer: PRIVATE HEALTH INSURANCE | Admitting: Internal Medicine

## 2019-01-31 ENCOUNTER — Telehealth (INDEPENDENT_AMBULATORY_CARE_PROVIDER_SITE_OTHER): Payer: PRIVATE HEALTH INSURANCE | Admitting: Internal Medicine

## 2019-01-31 VITALS — BP 150/86 | HR 36 | Temp 96.8°F | Ht 73.0 in | Wt 195.2 lb

## 2019-01-31 DIAGNOSIS — I442 Atrioventricular block, complete: Secondary | ICD-10-CM

## 2019-01-31 DIAGNOSIS — I77810 Thoracic aortic ectasia: Secondary | ICD-10-CM

## 2019-01-31 DIAGNOSIS — I517 Cardiomegaly: Secondary | ICD-10-CM

## 2019-01-31 DIAGNOSIS — R030 Elevated blood-pressure reading, without diagnosis of hypertension: Secondary | ICD-10-CM

## 2019-01-31 NOTE — Progress Notes (Signed)
Electrophysiology TeleHealth Note   Due to national recommendations of social distancing due to COVID 19, an audio/video telehealth visit is felt to be most appropriate for this patient at this time.  See MyChart message from today for the patient's consent to telehealth for [facility]   Date:  01/31/2019   ID:  Calvin [name], DOB Feb 20, 1951, MRN [id number]  Location: patient's home  Provider location: [address] [place]  Evaluation Performed: Follow-up visit  PCP:  Hulan Fess, MD  Cardiologist:     Electrophysiologist:  SK   Chief Complaint:  Complete heart block  History of Present Illness:    Calvin [name] is a 68 y.o. male who presents via audio/video conferencing for a telehealth visit today.  Since last being seen in our clinic for complete heart block  the patient reports doing pretty well.  He is still working but is sheltered at home.  He has put on extra weight over the last year and has not been exercising/running as much.  He is walking.  He has not played a lot of tennis in the context of COVID.  Notes balance issues in the wake of his Bell's palsy.  This continues to be an issue primarily with turning of his head  Denies chest pain.  There is some preliminary end of the day.  His diet since he has been at home while having and less alcohol is much more diffuse with sodium.  No shortness of breath.  When he walks, his [employer] smart watch says his heart rates are into the 180s.??  Resting rates are in the 30s    The patient denies symptoms of fevers, chills, cough, or new SOB worrisome for COVID 19.    Past Medical History:  Diagnosis Date  . Atrioventricular block, complete ([place])   . Diverticulosis of colon    Left, colonic  . High frequency hearing loss   . Hx of adenomatous colonic polyps   . Hyperlipidemia   . LVH (left ventricular hypertrophy) 2011   concentric, on echocardiogram   . Rheumatic fever   . Skin cancer,  basal cell     Past Surgical History:  Procedure Laterality Date  . Arthroscopic knee surgery    . Basal cell skin cancer surgery      Current Outpatient Medications  Medication Sig Dispense Refill  . B Complex Vitamins (B COMPLEX 100 PO) Take 100 mg by mouth daily.    . Cholecalciferol (VITAMIN D PO) Take 10,000 Units by mouth daily.    Marland Kitchen levothyroxine (SYNTHROID, LEVOTHROID) 112 MCG tablet Take 112 mcg by mouth daily before breakfast.     . Turmeric 500 MG CAPS Take 1,000 mg by mouth daily.      No current facility-administered medications for this visit.     Allergies:   Patient has no known allergies.   Social History:  The patient  reports that he has quit smoking. He has never used smokeless tobacco. He reports that he does not drink alcohol or use drugs.   Family History:  The patient's   family history includes Bone cancer in his mother; Hyperlipidemia in his father.   ROS:  Please see the history of present illness.   All other systems are personally reviewed and negative.    Exam:    Vital Signs:  BP (!) 150/86   Pulse (!) 36   Temp (!) 96.8 F (36 C)   Ht 6\' 1"  (1.854 m)  Wt 195 lb 3.2 oz (88.5 kg)   BMI 25.75 kg/m     Well appearing, alert and conversant, regular work of breathing,  good skin color Eyes- anicteric, neuro- grossly intact, skin- no apparent rash or lesions or cyanosis, mouth- oral mucosa is pink Facial asymmetry consistent with Bell's   Labs/Other Tests and Data Reviewed:    Recent Labs: No results found for requested labs within last 8760 hours.   Wt Readings from Last 3 Encounters:  01/31/19 195 lb 3.2 oz (88.5 kg)  01/13/18 199 lb (90.3 kg)  01/05/17 197 lb (89.4 kg)     Other studies personally reviewed: Additional studies/ records that were reviewed today include:     ASSESSMENT & PLAN:    Complete heart block with narrow QRS escape  LVH   BP elevation  Aortic Root dilitation  Bell's Palsy  ? Cause   Patient does  not have any clear symptoms attributable to his bradycardia.  Wearable monitor suggest his heart rate goes up to 180???.  We will try to elucidate heart rate excursion using a ZIO Patch.  Had a discussion regarding the role of pacing as prophylaxis for procedures since he keeps getting a "hairy eyeball "when he presents to the hospital given his bradycardia.  Told him that this was not indicated.    Imbalance likely related to his Bell's palsy.  Have suggested neurological evaluation and follow-up.  He is quite uncomfortable on his feet and wonder whether physical therapy might be of benefit.  Will defer to Dr. Rex Kras.  LVH is questionable.  Went back and looked at the MRI scan and report from 2013 which made no comments on asymmetry.  The echo was quite asymmetric at 14/9.  There is also mild aortic dilatation.  At 38 mm this would prompt annual surveillance, fluoroquinolone avoidance and in the context of his elevated blood pressures (140s-160s at home for the most part) antihypertensive therapy.  For now we will have him work on getting an accurate measurement of his blood pressure prior to initiating therapy.  At that point we will probably use losartan.  We will plan echocardiogram to reassess left ventricular hypertrophy, asymmetry as well as aortic root    COVID 19 screen The patient denies symptoms of COVID 19 at this time.  The importance of social distancing was discussed today.  Follow-up: 4-5 weeks telehealth visit      Current medicines are reviewed at length with the patient today.   The patient does not have concerns regarding his medicines.  The following changes were made today:  none  Labs/ tests ordered today include: ZIO XT  No orders of the defined types were placed in this encounter.   Future tests ( post COVID )     Patient Risk:  after full review of this patients clinical status, I feel that they are at moderate  risk at this time.  Today, I have spent 36  minutes with the patient with telehealth technology discussing the above.  Signed, Virl Axe, MD  01/31/2019 3:59 PM     [address] [place] [address] [phone] (office) [phone] (fax)

## 2019-02-01 ENCOUNTER — Telehealth: Payer: Self-pay | Admitting: Radiology

## 2019-02-01 NOTE — Telephone Encounter (Signed)
Enrolled patient for a 14 day Zio long term monitor to be mailed. Brief instructions were gone over with patient and he knows to expect the monitor to arrive in 3-4 days.

## 2019-02-06 ENCOUNTER — Ambulatory Visit (INDEPENDENT_AMBULATORY_CARE_PROVIDER_SITE_OTHER): Payer: PRIVATE HEALTH INSURANCE

## 2019-02-06 DIAGNOSIS — I77810 Thoracic aortic ectasia: Secondary | ICD-10-CM

## 2019-02-06 DIAGNOSIS — I517 Cardiomegaly: Secondary | ICD-10-CM

## 2019-02-06 DIAGNOSIS — I442 Atrioventricular block, complete: Secondary | ICD-10-CM

## 2019-02-06 DIAGNOSIS — R030 Elevated blood-pressure reading, without diagnosis of hypertension: Secondary | ICD-10-CM | POA: Diagnosis not present

## 2019-03-01 ENCOUNTER — Other Ambulatory Visit: Payer: Self-pay

## 2019-03-14 ENCOUNTER — Telehealth: Payer: Self-pay | Admitting: Internal Medicine

## 2019-03-14 DIAGNOSIS — G4733 Obstructive sleep apnea (adult) (pediatric): Secondary | ICD-10-CM

## 2019-03-14 NOTE — Telephone Encounter (Signed)
New Message   Patient calling back for Holter monitor test results.

## 2019-03-17 NOTE — Telephone Encounter (Signed)
Pt is aware of monitor results. He is interested in a sleep study, however weary of cost.   I will clarify with Dr Caryl Comes as to what type of sleep study he prefers (in lab or home) and call pt back with recommendation. He understands pre-auth will call him with estimated out of pocket cost and he may need to call his insurance company as well for an estimate.

## 2019-03-17 NOTE — Telephone Encounter (Signed)
At home  Sleep study   [facility] Thx SK

## 2019-03-22 ENCOUNTER — Telehealth: Payer: Self-pay | Admitting: *Deleted

## 2019-03-22 NOTE — Telephone Encounter (Signed)
-----   Message from Dollene Primrose, RN sent at 03/22/2019 12:07 PM EDT ----- Regarding: Home sleep study Hello  Dr Caryl Comes would like for this pt to be set up with an at home sleep study.  Thanks! Lor[place]

## 2019-03-22 NOTE — Telephone Encounter (Signed)
Spoke with pt and gave recommendation for home sleep study. Pt understands he will receive a call from our office to arrange.

## 2019-03-28 NOTE — Telephone Encounter (Addendum)
Spoke to patient concerning his insurance and he states he already gave his insurance information to someone at our office. After checking I spoke to the front desk staff Meeka who states the patient uploaded his insurance information in his mychart and she was able to get it in his chart. It can be seen under the COVERAGE ID TAB.  HST sent back to precert

## 2019-03-28 NOTE — Telephone Encounter (Signed)
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 It states the Christella Scheuermann has elapsed. Check with patient. Otherwise. Ok to schedule HST. Nothing to pre cert.

## 2019-04-05 ENCOUNTER — Other Ambulatory Visit: Payer: Self-pay | Admitting: Gastroenterology

## 2019-04-08 ENCOUNTER — Telehealth: Payer: Self-pay | Admitting: *Deleted

## 2019-04-08 NOTE — Telephone Encounter (Signed)
-----   Message from Freada Bergeron, [place] sent at 03/24/2019  1:58 PM EDT ----- Regarding: RE: Home sleep study Under coverage ID not insurance card. Thanks  ----- Message ----- From: Lauralee Evener, CMA Sent: 03/22/2019   1:34 PM EDT To: Freada Bergeron, CMA Subject: RE: Home sleep study                           According to the chart this patient does not have any insurance. It states the Christella Scheuermann has elapsed. Check with patient. Otherwise. Ok to schedule HST. Nothing to pre cert. ----- Message ----- From: Dollene Primrose, RN Sent: 03/22/2019  12:07 PM EDT To: Freada Bergeron, CMA, Cv [facility] Sleep Studies Subject: Home sleep study                               Hello  Dr Caryl Comes would like for this pt to be set up with an at home sleep study.  Thanks! Lorren

## 2019-04-08 NOTE — Telephone Encounter (Signed)
Staff message sent to Gae Bon s/w Marly L. with [employer] today. No PA is required for HST. Ok to schedule.

## 2019-04-12 ENCOUNTER — Telehealth: Payer: Self-pay | Admitting: *Deleted

## 2019-04-12 NOTE — Telephone Encounter (Signed)
-----   Message from Lauralee Evener, [place] sent at 04/08/2019  2:18 PM EDT ----- Regarding: RE: Home sleep study Per patient's insurance no PA is required. Ok to schedule HST. ----- Message ----- From: Freada Bergeron, CMA Sent: 03/24/2019   1:58 PM EDT To: Lauralee Evener, CMA Subject: RE: Home sleep study                           Under coverage ID not insurance card. Thanks  ----- Message ----- From: Lauralee Evener, CMA Sent: 03/22/2019   1:34 PM EDT To: Freada Bergeron, CMA Subject: RE: Home sleep study                           According to the chart this patient does not have any insurance. It states the Christella Scheuermann has elapsed. Check with patient. Otherwise. Ok to schedule HST. Nothing to pre cert. ----- Message ----- From: Dollene Primrose, RN Sent: 03/22/2019  12:07 PM EDT To: Freada Bergeron, CMA, Cv [facility] Sleep Studies Subject: Home sleep study                               Hello  Dr Caryl Comes would like for this pt to be set up with an at home sleep study.  Thanks! Lorren

## 2019-04-12 NOTE — Telephone Encounter (Signed)
Patient is aware and agreeable to Home Sleep Study through [facility]. Patient is scheduled for 9/23 at 2 pm to pick up home sleep kit and meet with Respiratory therapist at [facility]. Patient is aware that if this appointment date and time does not work for them they should contact Artis Delay directly at [phone]. Patient is aware that a sleep packet will be sent from [facility] in week. Patient is agreeable to treatment and thankful for call.

## 2019-04-14 ENCOUNTER — Other Ambulatory Visit: Payer: Self-pay

## 2019-04-14 ENCOUNTER — Other Ambulatory Visit (HOSPITAL_COMMUNITY)
Admission: RE | Admit: 2019-04-14 | Discharge: 2019-04-14 | Disposition: A | Discharge status: Home / Self Care | Payer: 59 | Source: Ambulatory Visit | Attending: Gastroenterology | Admitting: Gastroenterology

## 2019-04-14 ENCOUNTER — Encounter (HOSPITAL_COMMUNITY): Payer: Self-pay | Admitting: *Deleted

## 2019-04-14 DIAGNOSIS — Z1159 Encounter for screening for other viral diseases: Secondary | ICD-10-CM | POA: Insufficient documentation

## 2019-04-14 LAB — SARS CORONAVIRUS 2 (TAT 6-24 HRS): SARS Coronavirus 2: NEGATIVE

## 2019-04-18 ENCOUNTER — Encounter (HOSPITAL_COMMUNITY)
Admission: RE | Disposition: A | Discharge status: Home / Self Care | Payer: Self-pay | Source: Home / Self Care | Attending: Gastroenterology

## 2019-04-18 ENCOUNTER — Encounter (HOSPITAL_COMMUNITY): Payer: Self-pay | Admitting: *Deleted

## 2019-04-18 ENCOUNTER — Other Ambulatory Visit: Payer: Self-pay

## 2019-04-18 ENCOUNTER — Ambulatory Visit (HOSPITAL_COMMUNITY): Payer: 59 | Admitting: Anesthesiology

## 2019-04-18 ENCOUNTER — Ambulatory Visit (HOSPITAL_COMMUNITY)
Admission: RE | Admit: 2019-04-18 | Discharge: 2019-04-18 | Disposition: A | Discharge status: Home / Self Care | Payer: 59 | Attending: Gastroenterology | Admitting: Gastroenterology

## 2019-04-18 DIAGNOSIS — Z86010 Personal history of colonic polyps: Secondary | ICD-10-CM | POA: Diagnosis not present

## 2019-04-18 DIAGNOSIS — Z85828 Personal history of other malignant neoplasm of skin: Secondary | ICD-10-CM | POA: Diagnosis not present

## 2019-04-18 DIAGNOSIS — Z79890 Hormone replacement therapy (postmenopausal): Secondary | ICD-10-CM | POA: Insufficient documentation

## 2019-04-18 DIAGNOSIS — R001 Bradycardia, unspecified: Secondary | ICD-10-CM | POA: Insufficient documentation

## 2019-04-18 DIAGNOSIS — K640 First degree hemorrhoids: Secondary | ICD-10-CM | POA: Insufficient documentation

## 2019-04-18 DIAGNOSIS — Z87891 Personal history of nicotine dependence: Secondary | ICD-10-CM | POA: Insufficient documentation

## 2019-04-18 DIAGNOSIS — Z1211 Encounter for screening for malignant neoplasm of colon: Secondary | ICD-10-CM | POA: Diagnosis not present

## 2019-04-18 DIAGNOSIS — I4430 Unspecified atrioventricular block: Secondary | ICD-10-CM | POA: Insufficient documentation

## 2019-04-18 DIAGNOSIS — K5730 Diverticulosis of large intestine without perforation or abscess without bleeding: Secondary | ICD-10-CM | POA: Diagnosis not present

## 2019-04-18 HISTORY — PX: COLONOSCOPY WITH PROPOFOL: SHX5780

## 2019-04-18 SURGERY — COLONOSCOPY WITH PROPOFOL
Anesthesia: Monitor Anesthesia Care

## 2019-04-18 MED ORDER — PROPOFOL 500 MG/50ML IV EMUL
INTRAVENOUS | Status: DC | PRN
  Administered 2019-04-18: 100 ug/kg/min via INTRAVENOUS

## 2019-04-18 MED ORDER — PROPOFOL 10 MG/ML IV BOLUS
INTRAVENOUS | Status: DC | PRN
  Administered 2019-04-18: 20 mg via INTRAVENOUS

## 2019-04-18 MED ORDER — SODIUM CHLORIDE 0.9 % IV SOLN: INTRAVENOUS | Status: DC

## 2019-04-18 MED ORDER — LACTATED RINGERS IV SOLN
INTRAVENOUS | Status: DC
  Administered 2019-04-18: 1000 mL via INTRAVENOUS

## 2019-04-18 SURGICAL SUPPLY — 22 items

## 2019-04-18 NOTE — Anesthesia Preprocedure Evaluation (Signed)
Anesthesia Evaluation  Patient identified by MRN, date of birth, ID band Patient awake    Reviewed: Allergy & Precautions, NPO status , Patient's Chart, lab work & pertinent test results  Airway Mallampati: II  TM Distance: >3 FB Neck ROM: Full    Dental  (+) Teeth Intact, Dental Advisory Given   Pulmonary former smoker,    breath sounds clear to auscultation       Cardiovascular  Rhythm:Regular Rate:Normal     Neuro/Psych    GI/Hepatic   Endo/Other    Renal/GU      Musculoskeletal   Abdominal   Peds  Hematology   Anesthesia Other Findings   Reproductive/Obstetrics                             Anesthesia Physical Anesthesia Plan  ASA: III  Anesthesia Plan: MAC   Post-op Pain Management:    Induction: Intravenous  PONV Risk Score and Plan: Ondansetron  Airway Management Planned: Natural Airway and Nasal Cannula  Additional Equipment:   Intra-op Plan:   Post-operative Plan:   Informed Consent: I have reviewed the patients History and Physical, chart, labs and discussed the procedure including the risks, benefits and alternatives for the proposed anesthesia with the patient or authorized representative who has indicated his/her understanding and acceptance.     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 Pt is active, plays tennis, denies SOB, light headedness, or syncope. Not felt to need a pacemaker by EP Cardiology.  Plan MAC.  Roberts Gaudy)        Anesthesia Quick Evaluation

## 2019-04-18 NOTE — Op Note (Signed)
[facility] Patient Name: Calvin [name] Procedure Date: 04/18/2019 MRN: [id number] Attending MD: Nancy Fetter Dr., MD Date of Birth: 18-Mar-1951 CSN: [id number] Age: 68 Admit Type: Inpatient Procedure:                Colonoscopy Indications:              High risk colon cancer surveillance: Personal                            history of colonic polyps, Last colonoscopy: 2014 Providers:                Joyice Faster. Oletta Lamas Dr., MD, Elmer Ramp. Tilden Dome, RN,                            Ladona Ridgel, Technician Referring MD:              Medicines:                Monitored Anesthesia Care Complications:            Bradycardia, pt known to have AV block, HR 29-35 Estimated Blood Loss:     Estimated blood loss: none. Estimated blood loss:                            none. Procedure:                Pre-Anesthesia Assessment:                           - Prior to the procedure, a History and Physical                            was performed, and patient medications and                            allergies were reviewed. The patient's tolerance of                            previous anesthesia was also reviewed. The risks                            and benefits of the procedure and the sedation                            options and risks were discussed with the patient.                            All questions were answered, and informed consent                            was obtained. Prior Anticoagulants: The patient has                            taken no previous anticoagulant or antiplatelet  agents. ASA Grade Assessment: III - A patient with                            severe systemic disease. After reviewing the risks                            and benefits, the patient was deemed in                            satisfactory condition to undergo the procedure.                           After obtaining informed consent, the colonoscope            was passed under direct vision. Throughout the                            procedure, the patient's blood pressure, pulse, and                            oxygen saturations were monitored continuously. The                            CF-HQ190L ([id number]) Olympus colonoscope was                            introduced through the anus and advanced to the the                            cecum, identified by appendiceal orifice and                            ileocecal valve. The colonoscopy was performed                            without difficulty. The patient tolerated the                            procedure. The quality of the bowel preparation was                            good. The ileocecal valve, appendiceal orifice, and                            rectum were photographed. Scope In: 10:48:18 AM Scope Out: 11:04:34 AM Scope Withdrawal Time: 0 hours 7 minutes 50 seconds  Total Procedure Duration: 0 hours 16 minutes 16 seconds  Findings:      The perianal and digital rectal examinations were normal.      Multiple small and large-mouthed diverticula were found in the sigmoid       colon and descending colon.      There is no endoscopic evidence of bleeding, mass, polyps or tumor in       the entire colon.      Non-bleeding internal hemorrhoids were found during retroflexion. The  hemorrhoids were medium-sized and Grade I (internal hemorrhoids that do       not prolapse). Impression:               - Diverticulosis in the sigmoid colon and in the                            descending colon.                           - Non-bleeding internal hemorrhoids.                           - No specimens collected.                           - Personal history of colonic polyps. Moderate Sedation:      See anesthesia note, no moderate sedation. Recommendation:           - Repeat colonoscopy in 5 years for surveillance.                           - Patient has a contact number available for                             emergencies. The signs and symptoms of potential                            delayed complications were discussed with the                            patient. Return to normal activities tomorrow.                            Written discharge instructions were provided to the                            patient.                           - Resume previous diet.                           - Continue present medications. Procedure Code(s):        --- Professional ---                           Z6109, Colorectal cancer screening; colonoscopy on                            individual at high risk Diagnosis Code(s):        --- Professional ---                           Z86.010, Personal history of colonic polyps                           K57.30, Diverticulosis of  large intestine without                            perforation or abscess without bleeding                           K64.0, First degree hemorrhoids CPT copyright 2019 American Medical Association. All rights reserved. The codes documented in this report are preliminary and upon coder review may  be revised to meet current compliance requirements. Nancy Fetter Dr., MD 04/18/2019 11:25:07 AM This report has been signed electronically. Number of Addenda: 0

## 2019-04-18 NOTE — H&P (Signed)
Subjective:   Patient is a 68 y.o. male presents with need for follow-up colonoscopy.  Had polyp removed in the past and surveillance colonoscopy 2014 was negative this is a 5-year follow-up.  He has a history of LVH and some type of AV block and has chosen not to have a pacemaker.. Procedure including risks and benefits discussed in office.  Patient Active Problem List   Diagnosis Date Noted  . Abdominal pulsatile mass 01/01/2016  . Atrioventricular block, complete ([place]) 07/12/2012   Past Medical History:  Diagnosis Date  . Atrioventricular block, complete ([place])   . Diverticulosis of colon    Left, colonic  . High frequency hearing loss   . Hx of adenomatous colonic polyps   . Hyperlipidemia   . LVH (left ventricular hypertrophy) 2011   concentric, on echocardiogram   . Rheumatic fever   . Skin cancer, basal cell     Past Surgical History:  Procedure Laterality Date  . Arthroscopic knee surgery    . Basal cell skin cancer surgery      Medications Prior to Admission  Medication Sig Dispense Refill Last Dose  . B Complex Vitamins (B COMPLEX 100 PO) Take 100 mg by mouth daily.   04/17/2019 at Unknown time  . Cholecalciferol (VITAMIN D3) 10 MCG (400 UNIT) CHEW Chew 400 Units by mouth daily.   Past Week at Unknown time  . ECHINACEA EXTRACT PO Take 1,520 mg by mouth daily as needed (for immune system support). 760 per capsule   04/17/2019 at Unknown time  . ibuprofen (ADVIL) 200 MG tablet Take 200 mg by mouth every 6 (six) hours as needed (pain/inflammation.).   Past Week at Unknown time  . levothyroxine (SYNTHROID, LEVOTHROID) 112 MCG tablet Take 112 mcg by mouth daily before breakfast.    04/18/2019 at 0715  . TURMERIC PO Take 1,000 mg by mouth daily as needed (for inflammation with activity).   Past Month at Unknown time  . vitamin C (ASCORBIC ACID) 500 MG tablet Take 500 mg by mouth 2 (two) times daily as needed (immune system boost).   Past Month at Unknown time   No Known  Allergies  Social History   Tobacco Use  . Smoking status: Former Research scientist (life sciences)  . Smokeless tobacco: Never Used  Substance Use Topics  . Alcohol use: Yes    Alcohol/week: 10.0 standard drinks    Types: 10 Cans of beer per week    Comment: weekly    Family History  Problem Relation Age of Onset  . Bone cancer Mother        Polio  . Hyperlipidemia Father      Objective:   Patient Vitals for the past 8 hrs:  BP Temp Temp src Resp SpO2 Height Weight  04/18/19 0957 (!) 186/72 (!) 97.2 F (36.2 C) Tympanic 11 100 % 6\' 1"  (1.854 m) 87.5 kg   No intake/output data recorded. No intake/output data recorded.   See MD Preop evaluation      Assessment:   1.  History of colon polyp 2.  History of unspecified AV block  Plan:   We will proceed with surveillance colonoscopy.  Have discussed with patient he is agreeable.

## 2019-04-18 NOTE — Transfer of Care (Signed)
Immediate Anesthesia Transfer of Care Note  Patient: QUASHAWN JEWKES  Procedure(s) Performed: Procedure(s): COLONOSCOPY WITH PROPOFOL (N/A)  Patient Location: PACU  Anesthesia Type:MAC  Level of Consciousness:  sedated, patient cooperative and responds to stimulation  Airway & Oxygen Therapy:Patient Spontanous Breathing and Patient connected to face mask oxgen  Post-op Assessment:  Report given to PACU RN and Post -op Vital signs reviewed and stable  Post vital signs:  Reviewed and stable  Last Vitals:  Vitals:   04/18/19 0957 04/18/19 1111  BP: (!) 186/72 (!) 112/55  Pulse:  (!) 30  Resp: 11 16  Temp: (!) 36.2 C   SpO2: 004% 59%    Complications: No apparent anesthesia complications

## 2019-04-18 NOTE — Anesthesia Postprocedure Evaluation (Signed)
Anesthesia Post Note  Patient: Calvin [name]  Procedure(s) Performed: COLONOSCOPY WITH PROPOFOL (N/A )     Patient location during evaluation: Endoscopy Anesthesia Type: MAC Level of consciousness: awake and alert Pain management: pain level controlled Vital Signs Assessment: post-procedure vital signs reviewed and stable Respiratory status: spontaneous breathing, nonlabored ventilation, respiratory function stable and patient connected to nasal cannula oxygen Cardiovascular status: stable and blood pressure returned to baseline Postop Assessment: no apparent nausea or vomiting Anesthetic complications: no    Last Vitals:  Vitals:   04/18/19 1111 04/18/19 1120  BP: (!) 112/55 (!) 120/59  Pulse: (!) 30 (!) 30  Resp: 16 15  Temp:    SpO2: 97% 100%    Last Pain:  Vitals:   04/18/19 1120  TempSrc:   PainSc: 0-No pain                 Chrissie Dacquisto COKER

## 2019-04-18 NOTE — Discharge Instructions (Signed)
YOU HAD AN ENDOSCOPIC PROCEDURE TODAY: Refer to the procedure report and other information in the discharge instructions given to you for any specific questions about what was found during the examination. If this information does not answer your questions, please call [facility] office at [phone] to clarify.   YOU SHOULD EXPECT: Some feelings of bloating in the abdomen. Passage of more gas than usual. Walking can help get rid of the air that was put into your GI tract during the procedure and reduce the bloating. If you had a lower endoscopy (such as a colonoscopy or flexible sigmoidoscopy) you may notice spotting of blood in your stool or on the toilet paper. Some abdominal soreness may be present for a day or two, also.  DIET: Your first meal following the procedure should be a light meal and then it is ok to progress to your normal diet. A half-sandwich or bowl of soup is an example of a good first meal. Heavy or fried foods are harder to digest and may make you feel nauseous or bloated. Drink plenty of fluids but you should avoid alcoholic beverages for 24 hours. If you had a esophageal dilation, please see attached instructions for diet.    Follow-up with cardiology about low pulse rate  ACTIVITY: Your care partner should take you home directly after the procedure. You should plan to take it easy, moving slowly for the rest of the day. You can resume normal activity the day after the procedure however YOU SHOULD NOT DRIVE, use power tools, machinery or perform tasks that involve climbing or major physical exertion for 24 hours (because of the sedation medicines used during the test).   SYMPTOMS TO REPORT IMMEDIATELY: A gastroenterologist can be reached at any hour. Please call [phone]  for any of the following symptoms:   Following lower endoscopy (colonoscopy, flexible sigmoidoscopy) Excessive amounts of blood in the stool  Significant tenderness, worsening of abdominal pains    Swelling of the abdomen that is new, acute  Fever of 100 or higher   FOLLOW UP:  If any biopsies were taken you will be contacted by phone or by letter within the next 1-3 weeks. Call [phone]  if you have not heard about the biopsies in 3 weeks.  Please also call with any specific questions about appointments or follow up tests.

## 2019-04-19 ENCOUNTER — Encounter (HOSPITAL_COMMUNITY): Payer: Self-pay | Admitting: Gastroenterology

## 2019-04-22 ENCOUNTER — Other Ambulatory Visit: Payer: Self-pay

## 2019-04-22 ENCOUNTER — Ambulatory Visit (HOSPITAL_COMMUNITY): Payer: 59 | Attending: Internal Medicine

## 2019-04-22 DIAGNOSIS — I77810 Thoracic aortic ectasia: Secondary | ICD-10-CM | POA: Insufficient documentation

## 2019-04-22 DIAGNOSIS — I517 Cardiomegaly: Secondary | ICD-10-CM | POA: Diagnosis not present

## 2019-04-22 DIAGNOSIS — I442 Atrioventricular block, complete: Secondary | ICD-10-CM | POA: Diagnosis not present

## 2019-04-22 DIAGNOSIS — R030 Elevated blood-pressure reading, without diagnosis of hypertension: Secondary | ICD-10-CM | POA: Insufficient documentation

## 2019-05-17 ENCOUNTER — Telehealth: Payer: Self-pay | Admitting: *Deleted

## 2019-05-17 DIAGNOSIS — I77810 Thoracic aortic ectasia: Secondary | ICD-10-CM

## 2019-05-17 NOTE — Telephone Encounter (Signed)
-----   Message from Dollene Primrose, RN sent at 05/16/2019  5:45 PM EDT -----  ----- Message ----- From: Deboraha Sprang, MD Sent: 05/14/2019   2:30 PM EDT To: Emily Filbert, RN, Dollene Primrose, RN  Please Inform Patient Echo showed  Normal  heart muscle function withOUT evidence of asymmetry or thickening this is great  THe base of the aorta also looks normal for your size  Lets do chest CTA to measure the aorta itself and we may be able to also dismiss that concern in this young man Thanks SK

## 2019-05-17 NOTE — Telephone Encounter (Signed)
I spoke with pt and reviewed results/recommendations with him.  He would like to proceed with chest CTA.  He had lab work done recently at primary care (August 7).  I will contact Dr. Eddie Dibbles office to get these results. Pt reports he saw primary care recently and MD asked about follow up AAA duplex. Chart reviewed and this was done in April 2017.  Will forward to Dr. Caryl Comes to see if he would like pt to have AAA duplex repeated now.

## 2019-05-18 NOTE — Telephone Encounter (Signed)
Per DPR OK to leave message on voicemail. I left message with information from Dr. Caryl Comes on voicemail.  Left message to call office if questions.

## 2019-05-18 NOTE — Telephone Encounter (Signed)
His last scan was normal in 2017   Unless he knows something new, there would be no reason to do ABD [place]  Thanks SK

## 2019-05-20 ENCOUNTER — Other Ambulatory Visit: Payer: Self-pay

## 2019-05-20 DIAGNOSIS — Z01812 Encounter for preprocedural laboratory examination: Secondary | ICD-10-CM

## 2019-06-06 ENCOUNTER — Other Ambulatory Visit: Payer: 59 | Admitting: *Deleted

## 2019-06-06 ENCOUNTER — Other Ambulatory Visit: Payer: Self-pay

## 2019-06-06 DIAGNOSIS — Z01812 Encounter for preprocedural laboratory examination: Secondary | ICD-10-CM

## 2019-06-06 LAB — BASIC METABOLIC PANEL
BUN/Creatinine Ratio: 13 (ref 10–24)
BUN: 14 mg/dL (ref 8–27)
CO2: 22 mmol/L (ref 20–29)
Calcium: 9.8 mg/dL (ref 8.6–10.2)
Chloride: 103 mmol/L (ref 96–106)
Creatinine, Ser: 1.1 mg/dL (ref 0.76–1.27)
GFR calc Af Amer: 79 mL/min/{1.73_m2} (ref >59)
GFR calc non Af Amer: 68 mL/min/{1.73_m2} (ref >59)
Glucose: 86 mg/dL (ref 65–99)
Potassium: 4 mmol/L (ref 3.5–5.2)
Sodium: 140 mmol/L (ref 134–144)

## 2019-06-13 ENCOUNTER — Other Ambulatory Visit: Payer: Self-pay

## 2019-06-13 ENCOUNTER — Ambulatory Visit (INDEPENDENT_AMBULATORY_CARE_PROVIDER_SITE_OTHER)
Admission: RE | Admit: 2019-06-13 | Discharge: 2019-06-13 | Disposition: A | Discharge status: Home / Self Care | Payer: 59 | Source: Ambulatory Visit | Attending: Internal Medicine | Admitting: Internal Medicine

## 2019-06-13 DIAGNOSIS — I77810 Thoracic aortic ectasia: Secondary | ICD-10-CM

## 2019-06-13 MED ORDER — IOHEXOL 350 MG/ML SOLN
100.0000 mL | Freq: Once | INTRAVENOUS | Status: AC | PRN
  Administered 2019-06-13: 100 mL via INTRAVENOUS

## 2019-06-15 ENCOUNTER — Ambulatory Visit (HOSPITAL_BASED_OUTPATIENT_CLINIC_OR_DEPARTMENT_OTHER): Payer: 59

## 2019-06-15 ENCOUNTER — Other Ambulatory Visit: Payer: Self-pay

## 2019-08-16 ENCOUNTER — Ambulatory Visit (HOSPITAL_BASED_OUTPATIENT_CLINIC_OR_DEPARTMENT_OTHER): Payer: 59 | Attending: Internal Medicine | Admitting: Cardiovascular Disease

## 2019-08-16 ENCOUNTER — Other Ambulatory Visit: Payer: Self-pay

## 2019-08-16 DIAGNOSIS — G4733 Obstructive sleep apnea (adult) (pediatric): Secondary | ICD-10-CM | POA: Diagnosis not present

## 2019-08-25 NOTE — Procedures (Signed)
   Patient Name: Calvin, [name] Date: 08/16/2019 Gender: Male D.O.B: Oct 24, 1950 Age (years): 68 Referring Provider: Virl Axe Height (inches): 20 Interpreting Physician: Fransico Him MD, ABSM Weight (lbs): 191 RPSGT: Neeriemer, Holly BMI: 25 MRN: [id number] Neck Size: 17.00  CLINICAL INFORMATION Sleep Study Type: HST  Indication for sleep study: N/A  Epworth Sleepiness Score: 8  SLEEP STUDY TECHNIQUE A multi-channel overnight portable sleep study was performed. The channels recorded were: nasal airflow, thoracic respiratory movement, and oxygen saturation with a pulse oximetry. Snoring was also monitored.  MEDICATIONS Patient self administered medications include: N/A.  SLEEP ARCHITECTURE Patient was studied for 466.8 minutes. The sleep efficiency was 100.0 % and the patient was supine for 94.6%. The arousal index was 0.0 per hour.  RESPIRATORY PARAMETERS The overall AHI was 9.8 per hour, with a central apnea index of 0.0 per hour.  The oxygen nadir was 85% during sleep.  CARDIAC DATA Mean heart rate during sleep was 31.2 bpm.  IMPRESSIONS - Mild obstructive sleep apnea occurred during this study (AHI = 9.8/h). - No significant central sleep apnea occurred during this study (CAI = 0.0/h). - Moderate oxygen desaturation was noted during this study (Min O2 = 85%). - Patient snored 11.6% during the sleep.  DIAGNOSIS - Obstructive Sleep Apnea (327.23 [G47.33 ICD-10])  RECOMMENDATIONS - Recommend therapeutic CPAP titration to determine optimal pressure required to alleviate sleep disordered breathing. - Positional therapy avoiding supine position during sleep. - Surgical consultation for Uvulopalatopharyngoplasty (UPPP) may be considered. - Oral appliance may be considered. - Avoid alcohol, sedatives and other CNS depressants that may worsen sleep apnea and disrupt normal sleep architecture. - Sleep hygiene should be reviewed to assess factors that may  improve sleep quality. - Weight management and regular exercise should be initiated or continued.

## 2019-08-26 ENCOUNTER — Telehealth: Payer: Self-pay | Admitting: *Deleted

## 2019-08-26 NOTE — Telephone Encounter (Signed)
-----   Message from Sueanne Margarita, MD sent at 08/25/2019  8:05 AM EST ----- Please let patient know that they have sleep apnea and recommend CPAP titration. Please set up titration in the sleep lab.

## 2019-08-26 NOTE — Telephone Encounter (Signed)
Informed patient of sleep study results and patient understanding was verbalized. Patient understands her sleep study showed  they have sleep apnea and recommend CPAP titration. Please set up titration in the sleep lab.    titiration sent to sleep pool

## 2019-09-01 ENCOUNTER — Telehealth: Payer: Self-pay | Admitting: *Deleted

## 2019-09-01 NOTE — Telephone Encounter (Signed)
Informed patient of sleep study results and patient understanding was verbalized. Patient understands his sleep study they have sleep apnea and recommend CPAP titration. Please set up titration in the sleep lab. Pt is aware and agreeable to his results. Titration sent to precert

## 2019-09-01 NOTE — Telephone Encounter (Signed)
-----   Message from Sueanne Margarita, MD sent at 08/25/2019  8:05 AM EST ----- Please let patient know that they have sleep apnea and recommend CPAP titration. Please set up titration in the sleep lab.

## 2019-09-08 NOTE — Telephone Encounter (Signed)
RE: sleep study Tammy Sours, CMA  I have emailed this request to [facility].

## 2019-09-08 NOTE — Telephone Encounter (Signed)
This encounter was created in error - please disregard.

## 2019-09-08 NOTE — Addendum Note (Signed)
Addended by: Freada Bergeron on: 09/08/2019 01:52 PM   Modules accepted: Level of Service, SmartSet

## 2020-06-05 IMAGING — CT CT ANGIO CHEST
2 of 7 series · 18 of 46 positions shown · IV contrast (omnipaque)
Comparison: Cardiac MRI 08/05/2012

CLINICAL DATA: 68-year-old male with a history of dilated aortic
root

EXAM:
CT ANGIOGRAPHY CHEST WITH CONTRAST
TECHNIQUE: Multidetector CT imaging of the chest was performed using the
standard protocol during bolus administration of intravenous
contrast. Multiplanar CT image reconstructions and MIPs were
obtained to evaluate the vascular anatomy.
CONTRAST:  100mL OMNIPAQUE IOHEXOL 350 MG/ML SOLN

[Series 4: aorta 3.0 i31f 2 · axial · 0.78mm/px · z∈[-384,-57]mm · 15 of 119 slices shown]
[im 5/119  lung]
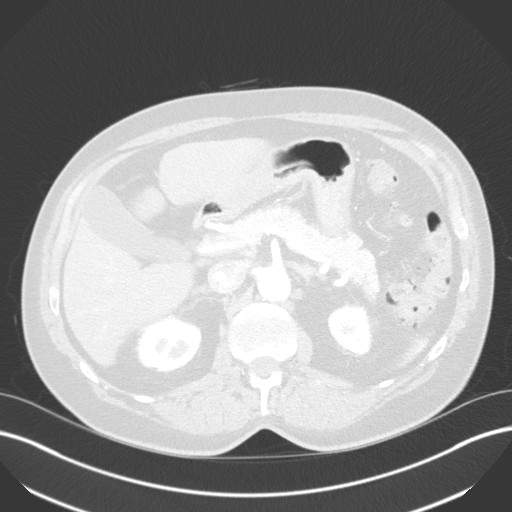
[im 14/119  soft-tissue]
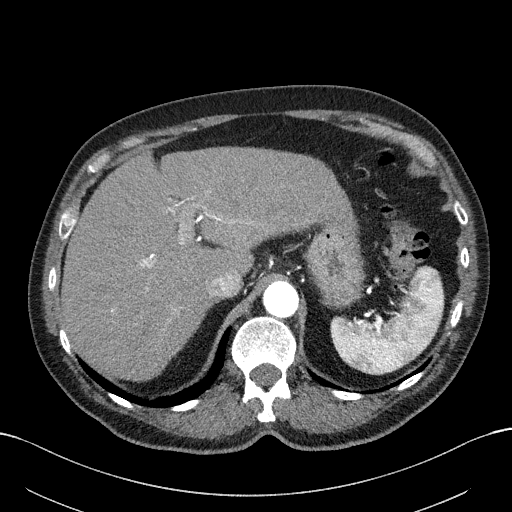
[im 23/119  lung]
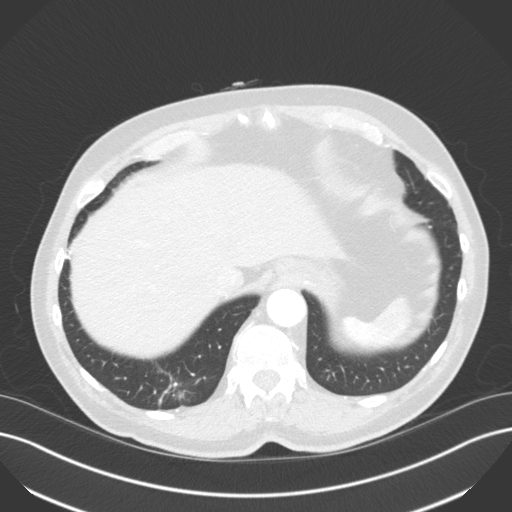
[im 28/119  soft-tissue]
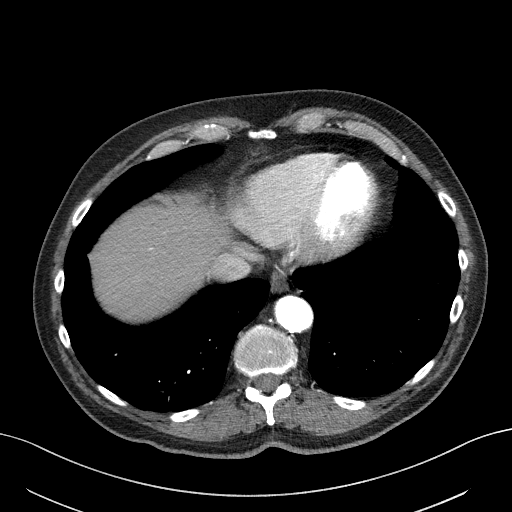
[im 37/119  lung]
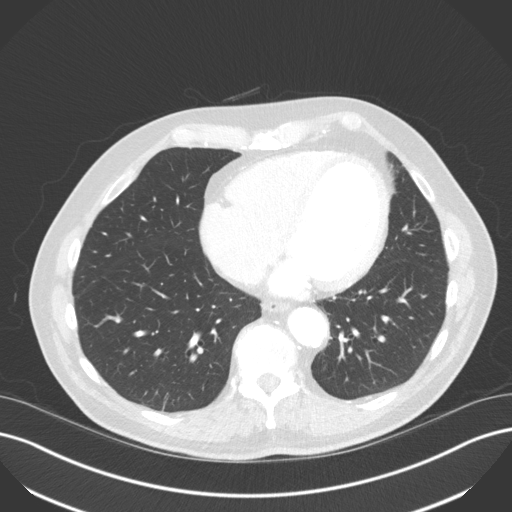
[im 46/119  soft-tissue]
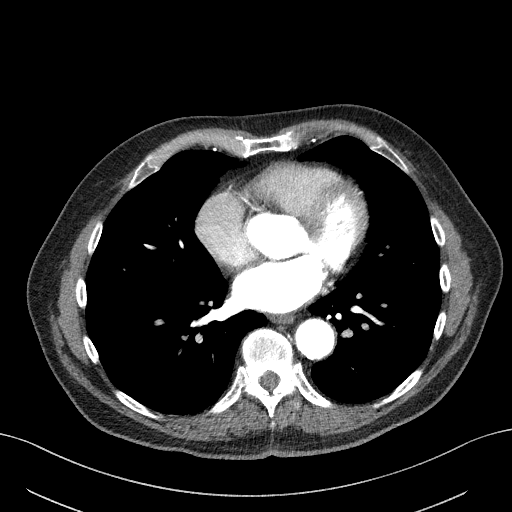
[im 50/119  lung]
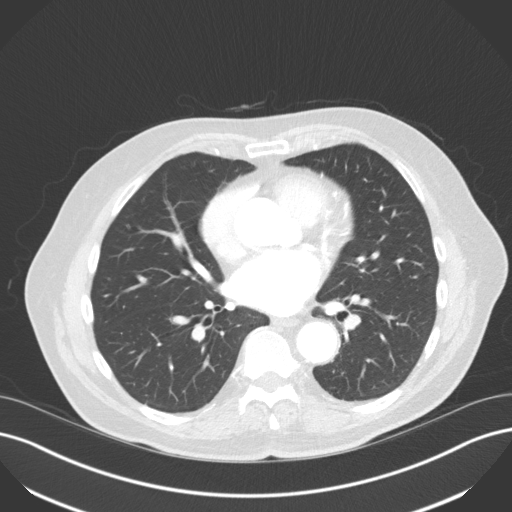
[im 60/119  soft-tissue]
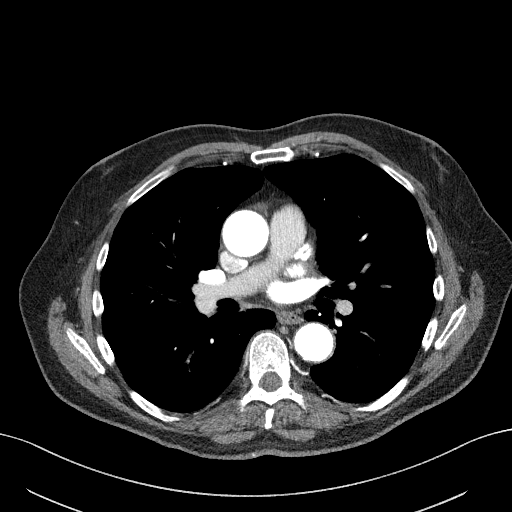
[im 69/119  lung]
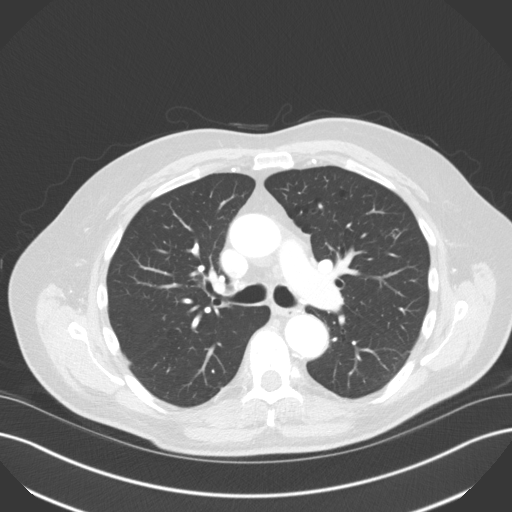
[im 73/119  soft-tissue]
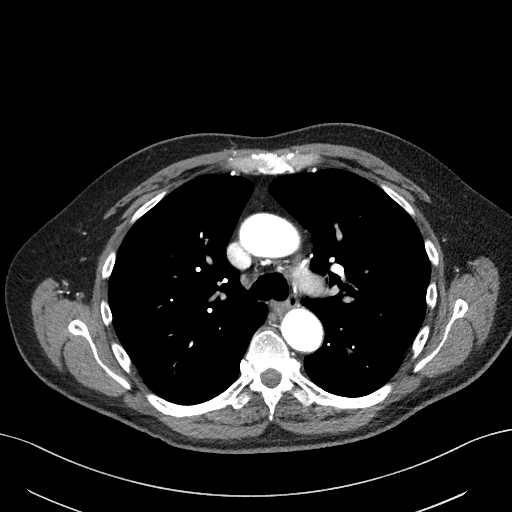
[im 82/119  lung]
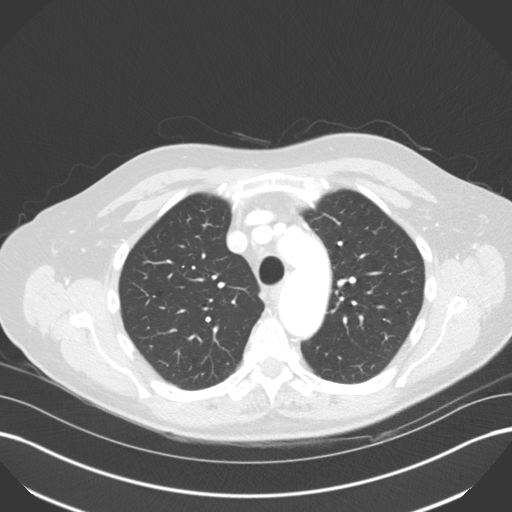
[im 91/119  soft-tissue]
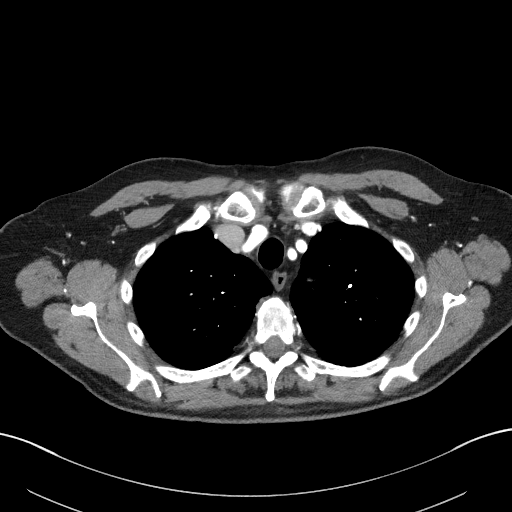
[im 96/119  lung]
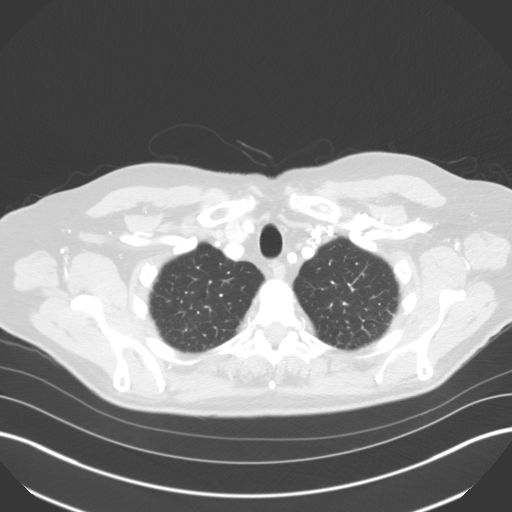
[im 105/119  soft-tissue]
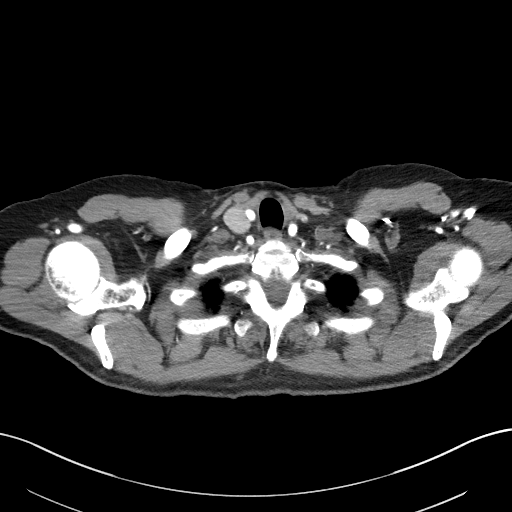
[im 114/119  lung]
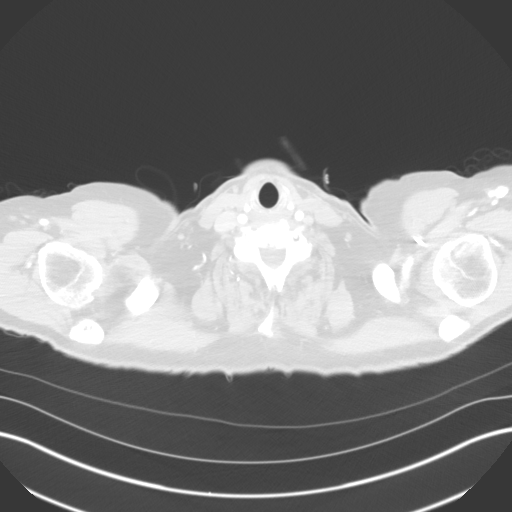

[Series 7: coronals · coronal · 0.73mm/px · 3 of 128 slices shown]
[im 32/128  soft-tissue]
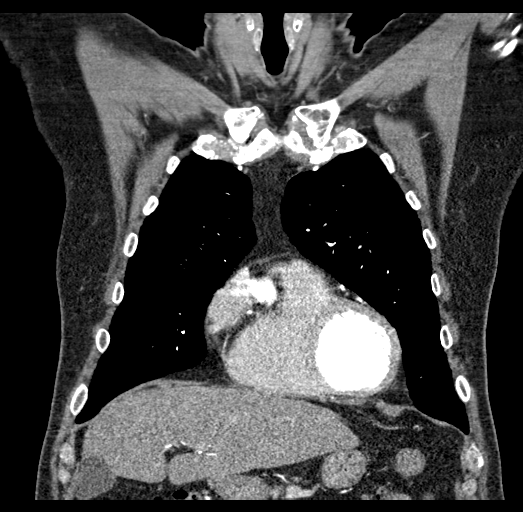
[im 64/128  soft-tissue]
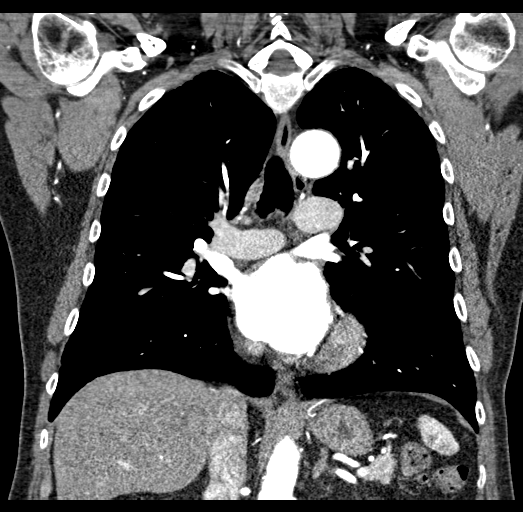
[im 96/128  soft-tissue]
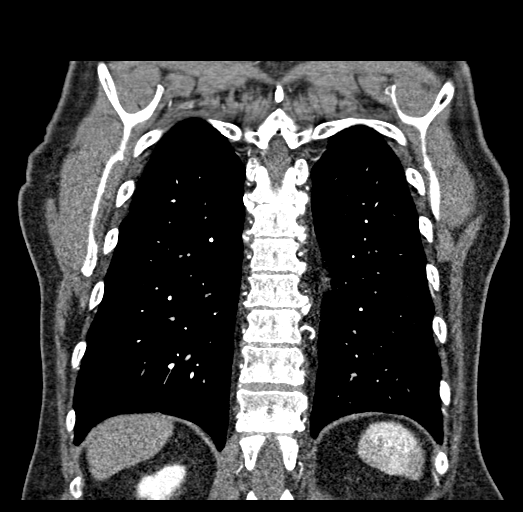

[18 of 46 positions shown; findings below may reference images not displayed]

FINDINGS: Cardiovascular: Conventional 3 vessel arch anatomy. Significant
elongation of the aortic infundibulum and proximal descending
thoracic aorta resulting in a type 3 arch. The aortic root is normal
in caliber at 3.7 cm measured at the sinuses of Valsalva. Similarly,
the tubular portion of the ascending thoracic aorta is mildly
ectatic but not aneurysmal at 3.6 cm in diameter. The descending
thoracic aorta is normal in caliber throughout. There are a few
scattered calcified atherosclerotic plaques. No dissection or
ulceration. The main pulmonary artery is normal in size.
Calcifications are evident along the courses of the coronary
arteries. The heart is at the upper limits of normal for size. No
pericardial effusion.

Mediastinum/Nodes: Unremarkable CT appearance of the thyroid gland.
No suspicious mediastinal or hilar adenopathy. No soft tissue
mediastinal mass. The thoracic esophagus is unremarkable.

Lungs/Pleura: No focal consolidation, edema, pleural effusion or
pneumothorax. There are a few small calcified subpleural pulmonary
nodules in the periphery of the lingula as well as a region of
pleuroparenchymal scarring in the right lower lobe.

Upper Abdomen: Visualized upper abdominal organs are unremarkable.

Musculoskeletal: No acute osseous abnormality.

Review of the MIP images confirms the above findings.
IMPRESSION: 1. Normal caliber aortic root.
2. Minimal ectasia of the ascending thoracic aorta without aneurysm.
3.  Aortic Atherosclerosis ([id number]-170.0).
4. Coronary artery calcifications.
5. Additional ancillary findings as above.

## 2020-06-14 NOTE — Progress Notes (Signed)
Cardiology Office Note Date:  06/14/2020  Patient ID:  Calvin, [name] 1951-04-12, MRN [id number] PCP:  Hulan Fess, MD  Cardiologist/Electrophysiologist: Dr. Caryl Comes    Chief Complaint: annual visit  History of Present Illness: Calvin [name] is a 69 y.o. male with history of HLD, rheumatic fever, and asymptomatic advanced heart block (2:1 and CHB are described), bells palsy, LVH  He comes in today to be seen for Dr. Caryl Comes, last seen by him via tele health May 2020. He continued to have lingering issues from his Bells Palsy with balance issues es[ecially, was not playing as much tennis as usual 2/2 COVID and staying home, though was walking and reported doing well.  He reported resting rates by his watch in the 30's and exercise rates 180s. His BP was elevated, review of his echo that noted septal asymmetry though prior MRI did not, there was some AO root dilation and discussed initiating BP management, though decided to 1st establish more reliable BP monitoring 1st. Planned for ZIO patch Felt his balance instability was 2/2 to his palsy and recommended neuro eval via his PMD. Recommended annual surveillance of his Ao Discussed pacing not indicated for him Was to have had a 8mof/u visit though does not look like he did. Monitor noted:   Duration: 15 d Findings CHB with nocturnal hypervagatonia with PP prolongation suggestive of sleep apnea  HR >>110 w exdrcise-- junctional VT NS  One event   Nocturnal bradycardia >> 30s'  Literature review suggests that VT NS is not more common in OSA and is not assoc with SCD in OSA pts He should probably have a sleep study if he has not had one  Echo noted no symmetry, or dilation, though Dr. KCaryl Comesrecommended CT toget baseline and establish if any or not Aortic dilation CT was OK with normal aortic caliber Sleeps study confirmed apnea.   71/96/2229 had an uncomplicated colonoscopy  TODAY He continues to feel well, playing tennis  and jogging, thinks his jogging pace has slowed a bit over the year, No CP, occasionally seems aware of his heart beat.  NO dizzy spells, near syncope or syncope, no SOB. He reports his PMD does his labs annually at least At his last PMD visit his BP a little high and was some discussion that he may need to start BP medication. He opted not to pursue CPA titration study. Was uncomfortable having never met the MD prescribing the therapy and was not convinced the study was done well apparently having had to repeat it. In d/w his PMD and is scheduled next week for a repeat sleep study/2nd opinion  He is thinking about surgery to help lift his R eyelid/forehead drrop from his Bells Palsy, though in meeting the MD and description of the surgery was a bit uncomfortable having it done in his office with his known heart block.     Past Medical History:  Diagnosis Date  . [place]   . Diverticulosis of colon    Left, colonic  . High frequency hearing loss   . Hx of adenomatous colonic polyps   . Hyperlipidemia   . LVH (left ventricular hypertrophy) 2011   concentric, on echocardiogram   . Rheumatic fever   . Skin cancer, basal cell     Past Surgical History:  Procedure Laterality Date  . Arthroscopic knee surgery    . Basal cell skin cancer surgery    . COLONOSCOPY WITH PROPOFOL N/A 04/18/2019  Procedure: COLONOSCOPY WITH PROPOFOL;  Surgeon: Laurence Spates, MD;  Location: [facility];  Service: Endoscopy;  Laterality: N/A;    Current Outpatient Medications  Medication Sig Dispense Refill  . B Complex Vitamins (B COMPLEX 100 PO) Take 100 mg by mouth daily.    . Cholecalciferol (VITAMIN D3) 10 MCG (400 UNIT) CHEW Chew 400 Units by mouth daily.    Marland Kitchen ECHINACEA EXTRACT PO Take 1,520 mg by mouth daily as needed (for immune system support). 760 per capsule    . ibuprofen (ADVIL) 200 MG tablet Take 200 mg by mouth every 6 (six) hours as needed  (pain/inflammation.).    Marland Kitchen levothyroxine (SYNTHROID, LEVOTHROID) 112 MCG tablet Take 112 mcg by mouth daily before breakfast.     . TURMERIC PO Take 1,000 mg by mouth daily as needed (for inflammation with activity).    . vitamin C (ASCORBIC ACID) 500 MG tablet Take 500 mg by mouth 2 (two) times daily as needed (immune system boost).     No current facility-administered medications for this visit.    Allergies:   Patient has no known allergies.   Social History:  The patient  reports that he has quit smoking. He has never used smokeless tobacco. He reports current alcohol use of about 10.0 standard drinks of alcohol per week. He reports that he does not use drugs.   Family History:  The patient's family history includes Bone cancer in his mother; Hyperlipidemia in his father.  ROS:  Please see the history of present illness.    All other systems are reviewed and otherwise negative.   PHYSICAL EXAM:  VS:  There were no vitals taken for this visit. BMI: There is no height or weight on file to calculate BMI. Well nourished, well developed, in no acute distress HEENT: normocephalic, atraumatic Neck: no JVD, carotid bruits or masses Cardiac:  RRR; bradycardicno significant murmurs, no rubs, or gallops Lungs:  CTA b/l, no wheezing, rhonchi or rales Abd: soft, nontender MS: no deformity or atrophy Ext:  no edema Skin: warm and dry, no rash Neuro:  R forehead/eye droop, no other gross deficits appreciated Psych: euthymic mood, full affect    EKG:  Done today and reviewed by myself shows  CHB 37bpm, QRS 183m (unchanged from 2019), sinus rate is 56-58  08/16/2019; sleep study IMPRESSIONS - Mild obstructive sleep apnea occurred during this study (AHI = 9.8/h). - No significant central sleep apnea occurred during this study (CAI = 0.0/h). - Moderate oxygen desaturation was noted during this study (Min O2 = 85%). - Patient snored 11.6% during the sleep.  DIAGNOSIS - Obstructive Sleep  Apnea (327.23 [G47.33 ICD-10])  RECOMMENDATIONS - Recommend therapeutic CPAP titration to determine optimal pressure required to alleviate sleep disordered breathing. - Positional therapy avoiding supine position during sleep. - Surgical consultation for Uvulopalatopharyngoplasty (UPPP) may be considered. - Oral appliance may be considered. - Avoid alcohol, sedatives and other CNS depressants that may worsen sleep apnea and disrupt normal sleep architecture. - Sleep hygiene should be reviewed to assess factors that may improve sleep quality. - Weight management and regular exercise should be initiated or continued.   06/13/2019: CT chest IMPRESSION: 1. Normal caliber aortic root. 2. Minimal ectasia of the ascending thoracic aorta without aneurysm. 3.  Aortic Atherosclerosis (ICD10-170.0). 4. Coronary artery calcifications. 5. Additional ancillary findings as above.    04/22/2019: TTE IMPRESSIONS  1. The left ventricle has normal systolic function, with an ejection  fraction of 55-60%. The cavity size was normal.  Mitral inflow is  indeterminate, however tissue Doppler is normal, suggesting overall normal  diastolic function.  2. The right ventricle has normal systolic function. The cavity was  normal. There is no increase in right ventricular wall thickness. Right  ventricular systolic pressure is normal with an estimated pressure of 30.9  mmHg.  3. The aortic valve is tricuspid. Sclerosis without any evidence of  stenosis of the aortic valve. Aortic valve regurgitation is trivial by  color flow Doppler.  4. The mitral valve is normal in structure. Mitral valve regurgitation is  mild by color flow Doppler. Diastolic mitral regurgitation is present.  5. Sinus of Valsalva measurement largely stable over time despite slight  variability in measurement, within normal limits when indexed to body  surface area. Ascending aorta not well visualized for comparison between  studies    Recent Labs: No results found for requested labs within last 8760 hours.  No results found for requested labs within last 8760 hours.   CrCl cannot be calculated (Patient's most recent lab result is older than the maximum 21 days allowed.).   Wt Readings from Last 3 Encounters:  08/16/19 191 lb (86.6 kg)  06/15/19 191 lb (86.6 kg)  04/18/19 193 lb (87.5 kg)     Other studies reviewed: Additional studies/records reviewed today include: summarized above  ASSESSMENT AND PLAN:  1. Asymptomatic advanced heart block     2:1 and CHB have been described    This diagnosis goes back as far as 2013     Remains asymptomatic  2. High BP     He checks periodically at home, gets 110's-130's, occ like today     Recommended to continue to monitor his BP and if on aferage his BP 130/80 or less would be acceptable     Should he develop HTN and need for BP management, discussed and written for him not to use class ov beta blockers or calcium channel blockers, in general no medicines that could also slow his heart rate   reviewed today's EKG a,d case with Dr. Caryl Comes.  He will further investigate for any updated recommendations for asymptomatic CHB and pacing. No changes for now. Discussed symptoms of bradycardia  Disposition: we will c/w annual visits, sooner if needed.  Current medicines are reviewed at length with the patient today.  The patient did not have any concerns regarding medicines.  Venetia Night, PA-C 06/14/2020 8:41 AM     [place] [address] [address] [place] [place] [address] [phone] (office)  [phone] (fax)

## 2020-06-15 ENCOUNTER — Other Ambulatory Visit: Payer: Self-pay

## 2020-06-15 ENCOUNTER — Ambulatory Visit: Payer: 59 | Admitting: Physician Assistant

## 2020-06-15 VITALS — BP 144/60 | HR 37 | Ht 73.0 in | Wt 198.0 lb

## 2020-06-15 DIAGNOSIS — I442 Atrioventricular block, complete: Secondary | ICD-10-CM | POA: Diagnosis not present

## 2020-06-15 DIAGNOSIS — R030 Elevated blood-pressure reading, without diagnosis of hypertension: Secondary | ICD-10-CM

## 2020-06-15 NOTE — Patient Instructions (Addendum)
Medication Instructions:   Your physician recommends that you continue on your current medications as directed. Please refer to the Current Medication list given to you today.  *If you need a refill on your cardiac medications before your next appointment, please call your pharmacy*   Lab Work: [place]   If you have labs (blood work) drawn today and your tests are completely normal, you will receive your results only by: Marland Kitchen MyChart Message (if you have MyChart) OR . A paper copy in the mail If you have any lab test that is abnormal or we need to change your treatment, we will call you to review the results.   Testing/Procedures: NONE ORDERED  TODAY   Follow-Up: At [facility], you and your health needs are our priority.  As part of our continuing mission to provide you with exceptional heart care, we have created designated Provider Care Teams.  These Care Teams include your primary Cardiologist (physician) and Advanced Practice Providers (APPs -  Physician Assistants and Nurse Practitioners) who all work together to provide you with the care you need, when you need it.  We recommend signing up for the patient portal called "MyChart".  Sign up information is provided on this After Visit Summary.  MyChart is used to connect with patients for Virtual Visits (Telemedicine).  Patients are able to view lab/test results, encounter notes, upcoming appointments, etc.  Non-urgent messages can be sent to your provider as well.   To learn more about what you can do with MyChart, go to [email or web address].    Your next appointment:   1 year(s)  The format for your next appointment:   In Person  Provider:   You may see Dr. Caryl Comes  or one of the following Advanced Practice Providers on your designated Care Team:    Chanetta Marshall, NP  Tommye Standard, PA-C  Legrand Como "Jonni Sanger" [place], [place]    Other Instructions  Blood Pressure medicine drug class to avoid:  Beta   Blockers  Calcium Channel Blockers

## 2021-04-29 ENCOUNTER — Ambulatory Visit: Payer: 59 | Admitting: Internal Medicine

## 2021-04-29 DIAGNOSIS — I442 Atrioventricular block, complete: Secondary | ICD-10-CM

## 2021-05-02 NOTE — Progress Notes (Signed)
Patient ID: Calvin [name], male   DOB: 1951/04/10, 70 y.o.   MRN: [id number]      Patient Care Team: Hulan Fess, MD as PCP - General (Family Medicine)   HPI  Calvin [name] is a 70 y.o. male Seen in followup for asymptomatic complete heart block with LVH.  Nocturnal bradycardia with PP lengthening suggestive of hyper vagotonia and sleep apnea.  Underwent a sleep study 12/20 (AHI 11)  which was abnormal and a repeat sleep study was to be done  Pulse palsy is impacting his exercise over the years with temporally related challenges with balance not withstanding as he continues to play tennis.    HR up to 70's but only playing tennis;; last year HR were  into the 110-120 range   With running  The patient denies chest pain, shortness of breath, nocturnal dyspnea, orthopnea or peripheral edema.  There have been no palpitations, lightheadedness or syncope.  He has however noted a little bit of change in exercise tolerance.     Date Cr K Hgb  09/20 1.10 4.0 15.3(9/12)        DATE TEST EF%   11/13 CMRI 74%     8/17 Echo  55-60 %   7/20 Echo  55-60 %          Records and Results    Past Medical History:  Diagnosis Date   Atrioventricular block, complete ([place])    Diverticulosis of colon    Left, colonic   High frequency hearing loss    Hx of adenomatous colonic polyps    Hyperlipidemia    LVH (left ventricular hypertrophy) 2011   concentric, on echocardiogram    Rheumatic fever    Skin cancer, basal cell     Past Surgical History:  Procedure Laterality Date   Arthroscopic knee surgery     Basal cell skin cancer surgery     COLONOSCOPY WITH PROPOFOL N/A 04/18/2019   Procedure: COLONOSCOPY WITH PROPOFOL;  Surgeon: Laurence Spates, MD;  Location: [facility];  Service: Endoscopy;  Laterality: N/A;    Current Outpatient Medications  Medication Sig Dispense Refill   B Complex Vitamins (B COMPLEX 100 PO) Take 100 mg by mouth as needed.      Cholecalciferol (VITAMIN  D3) 10 MCG (400 UNIT) CHEW Chew 400 Units by mouth as needed.      ECHINACEA EXTRACT PO Take 1,520 mg by mouth daily as needed (for immune system support). 760 per capsule     ibuprofen (ADVIL) 200 MG tablet Take 200 mg by mouth every 6 (six) hours as needed (pain/inflammation.).     levothyroxine (SYNTHROID, LEVOTHROID) 112 MCG tablet Take 112 mcg by mouth daily before breakfast.      TURMERIC PO Take 1,000 mg by mouth daily as needed (for inflammation with activity).     vitamin C (ASCORBIC ACID) 500 MG tablet Take 500 mg by mouth 2 (two) times daily as needed (immune system boost).     No current facility-administered medications for this visit.    No Known Allergies    Review of Systems negative except from HPI and PMH Physical Exam: BP 132/76   Pulse (!) 32   Ht '6\' 1"'$  (1.854 m)   Wt 196 lb 3.2 oz (89 kg)   SpO2 95%   BMI 25.89 kg/m  Well developed and nourished in no acute distress HENT normal Neck supple with JVP-  flat   Lungs Clear Regular rate and rhythm, no murmurs or gallops  Abd-soft with active BS No Clubbing cyanosis edema Skin-warm and dry A & Oriented  Grossly normal sensory and motor function  ECG: sinus 65 CHB Ventricular rate 32 Intervals -/11/42  Assessment and  Plan  Complete heart block--narrow QRS escape  Bells Palsy    Sleep apnea  No episodes of lightheadedness with near syncope.  He has however noted some changes in exercise tolerance.  He will use an [employer] monitor to track heart rates with exercise.  I suspect that there may be some slowing of his junctional responsiveness.  He may wind up coming to pacing.  We have reviewed this.  Discussed also his sleep apnea the potential role for positional therapy as opposed to an appliance or CPAP.  Encouraged him to pursue this.

## 2021-05-03 ENCOUNTER — Encounter: Payer: Self-pay | Admitting: Internal Medicine

## 2021-05-03 ENCOUNTER — Other Ambulatory Visit: Payer: Self-pay

## 2021-05-03 ENCOUNTER — Ambulatory Visit: Payer: 59 | Admitting: Internal Medicine

## 2021-05-03 VITALS — BP 132/76 | HR 32 | Ht 73.0 in | Wt 196.2 lb

## 2021-05-03 DIAGNOSIS — I442 Atrioventricular block, complete: Secondary | ICD-10-CM

## 2021-05-03 NOTE — Patient Instructions (Signed)
Medication Instructions:  Your physician recommends that you continue on your current medications as directed. Please refer to the Current Medication list given to you today.  Labwork: None ordered.   Testing/Procedures: None ordered.  Follow-Up: Your physician recommends that you schedule a follow-up appointment in: 12 months with Dr. Caryl Comes  Any Other Special Instructions Will Be Listed Below (If Applicable).  [employer]Cor  FDA-cleared EKG at your fingertips. - [employer]   Agricultural engineer, [employer]. [email or web address]://[email or web address].[email or web address]   FDA-cleared, clinical grade mobile EKG monitor: Jodelle Red is the most clinically-validated mobile EKG used by the world's leading cardiac care medical professionals.  This may be useful in monitoring palpitations.  We do not have access to have them emailed and reviewed but will be glad to review while in the office.      If you need a refill on your cardiac medications before your next appointment, please call your pharmacy.

## 2022-06-24 DIAGNOSIS — G4730 Sleep apnea, unspecified: Secondary | ICD-10-CM | POA: Insufficient documentation

## 2022-06-25 ENCOUNTER — Ambulatory Visit: Payer: 59 | Attending: Internal Medicine | Admitting: Internal Medicine

## 2022-06-25 ENCOUNTER — Encounter: Payer: Self-pay | Admitting: Internal Medicine

## 2022-06-25 VITALS — BP 128/74 | HR 29 | Ht 73.0 in | Wt 201.6 lb

## 2022-06-25 DIAGNOSIS — G4730 Sleep apnea, unspecified: Secondary | ICD-10-CM

## 2022-06-25 DIAGNOSIS — I442 Atrioventricular block, complete: Secondary | ICD-10-CM | POA: Diagnosis not present

## 2022-06-25 NOTE — Progress Notes (Signed)
Patient ID: Calvin [name], male   DOB: 1951/09/10, 71 y.o.   MRN: [id number]      Patient Care Team: Hulan Fess, MD as PCP - General (Family Medicine)   HPI  Calvin [name] is a 71 y.o. male Seen in followup for asymptomatic complete heart block with LVH.  Nocturnal bradycardia with PP lengthening suggestive of hyper vagotonia and sleep apnea.  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   Continues to play tennis vigorously.  Up to 3 hours at a time a couple of times a week. The patient denies chest pain, shortness of breath, nocturnal dyspnea, orthopnea or peripheral edema.  There have been no palpitations, lightheadedness or syncope. .   Date Cr K Hgb  09/20 1.10 4.0 15.3(9/12)        DATE TEST EF%   11/13 CMRI 74%   Neg LGE  8/17 Echo  55-60 %   7/20 Echo  55-60 %          Records and Results    Past Medical History:  Diagnosis Date   Atrioventricular block, complete ([place])    Diverticulosis of colon    Left, colonic   High frequency hearing loss    Hx of adenomatous colonic polyps    Hyperlipidemia    LVH (left ventricular hypertrophy) 2011   concentric, on echocardiogram    Rheumatic fever    Skin cancer, basal cell     Past Surgical History:  Procedure Laterality Date   Arthroscopic knee surgery     Basal cell skin cancer surgery     COLONOSCOPY WITH PROPOFOL N/A 04/18/2019   Procedure: COLONOSCOPY WITH PROPOFOL;  Surgeon: Laurence Spates, MD;  Location: [facility];  Service: Endoscopy;  Laterality: N/A;    Current Outpatient Medications  Medication Sig Dispense Refill   B Complex Vitamins (B COMPLEX 100 PO) Take 100 mg by mouth as needed.      Cholecalciferol (VITAMIN D3) 10 MCG (400 UNIT) CHEW Chew 400 Units by mouth as needed.       COLLAGEN PO Take by mouth. Daily     ECHINACEA EXTRACT PO Take 1,520 mg by mouth daily as needed (for immune system support). 760 per capsule     ibuprofen (ADVIL) 200 MG tablet Take 200 mg by mouth every 6 (six) hours as needed (pain/inflammation.).     levothyroxine (SYNTHROID, LEVOTHROID) 112 MCG tablet Take 112 mcg by mouth daily before breakfast.      TURMERIC PO Take 1,000 mg by mouth daily as needed (for inflammation with activity).     vitamin C (ASCORBIC ACID) 500 MG tablet Take 500 mg by mouth 2 (two) times daily as needed (immune system boost).     No current facility-administered medications for this visit.    No Known Allergies    Review of Systems negative except from HPI and PMH Physical Exam: BP 128/74   Pulse (!) 29   Ht '6\' 1"'$  (1.854 m)   Wt 201 lb 9.6 oz (91.4 kg)   SpO2 99%   BMI 26.60 kg/m   Well developed and nourished in no acute distress HENT normal Neck supple with JVP-  flat  Clear Slow and regular rate and rhythm, no murmurs or gallops Abd-soft with active BS No Clubbing  cyanosis edema Skin-warm and dry A & Oriented  Grossly normal sensory and motor function  ECG sinus rhythm at 55 Complete heart block Ventricular rate 30 bpm with a narrow QRS Intervals-10/42 (QTc)  Assessment and  Plan  Complete heart block--narrow QRS escape  Bells Palsy    No symptoms related to his congenital complete heart block: Brief literature review today identifies a to a recommendation in the literature for pacing not withstanding the paucity of symptoms.  We have reviewed this briefly as we have further also discussed the implications of narrow versus wide QRS in the setting of complete heart block.  We will anticipate me doing a further literature review on this to try to find a basis for the 2 a recommendation for pacing in this asymptomatic cohort.

## 2022-06-25 NOTE — Patient Instructions (Signed)
Medication Instructions:  Your physician recommends that you continue on your current medications as directed. Please refer to the Current Medication list given to you today.  *If you need a refill on your cardiac medications before your next appointment, please call your pharmacy*   Lab Work: None ordered.  If you have labs (blood work) drawn today and your tests are completely normal, you will receive your results only by: [place] (if you have MyChart) OR A paper copy in the mail If you have any lab test that is abnormal or we need to change your treatment, we will call you to review the results.   Testing/Procedures: None ordered.\    Follow-Up: At [facility], you and your health needs are our priority.  As part of our continuing mission to provide you with exceptional heart care, we have created designated Provider Care Teams.  These Care Teams include your primary Cardiologist (physician) and Advanced Practice Providers (APPs -  Physician Assistants and Nurse Practitioners) who all work together to provide you with the care you need, when you need it.  We recommend signing up for the patient portal called "MyChart".  Sign up information is provided on this After Visit Summary.  MyChart is used to connect with patients for Virtual Visits (Telemedicine).  Patients are able to view lab/test results, encounter notes, upcoming appointments, etc.  Non-urgent messages can be sent to your provider as well.   To learn more about what you can do with MyChart, go to [email or web address].    Your next appointment:   Dan Europe will contact you next week regarding appointment to discuss and plan for possible pacemaker  Important Information About Sugar

## 2022-07-18 ENCOUNTER — Telehealth: Payer: Self-pay | Admitting: Internal Medicine

## 2022-07-18 NOTE — Telephone Encounter (Signed)
Pt called for Dr. Olin Pia nurse to schedule a Pacemaker consult appointment with Dr. Caryl Comes.   Will forward his message to Dr. Olin Pia RN.

## 2022-07-18 NOTE — Telephone Encounter (Signed)
Pt is requesting call back to discuss an appointment time to have pacemaker put in. Requesting call back.

## 2022-07-24 NOTE — Telephone Encounter (Signed)
Spoke with pt who states if he proceeds with pacemaker implant he would not schedule until after the first of the year.  Pt would like to know if Dr Caryl Comes has uncovered any additional information.  See excerpt from [place] note 06/25/2022.  Pt will need a follow up appointment to update H&P, EKG and labs.  Appointment scheduled for 09/24/2022 at 945am with Dr Caryl Comes.     We will anticipate me doing a further literature review on this to try to find a basis for the 2 a recommendation for pacing in this asymptomatic cohort.

## 2022-09-24 ENCOUNTER — Ambulatory Visit: Payer: 59 | Attending: Internal Medicine | Admitting: Internal Medicine

## 2022-09-24 ENCOUNTER — Encounter: Payer: Self-pay | Admitting: Internal Medicine

## 2022-09-24 VITALS — BP 156/84 | HR 33 | Ht 73.0 in | Wt 200.4 lb

## 2022-09-24 DIAGNOSIS — I442 Atrioventricular block, complete: Secondary | ICD-10-CM

## 2022-09-24 NOTE — Patient Instructions (Signed)
Medication Instructions:  Your physician recommends that you continue on your current medications as directed. Please refer to the Current Medication list given to you today.  *If you need a refill on your cardiac medications before your next appointment, please call your pharmacy*   Lab Work: None ordered.  If you have labs (blood work) drawn today and your tests are completely normal, you will receive your results only by: MyChart Message (if you have MyChart) OR A paper copy in the mail If you have any lab test that is abnormal or we need to change your treatment, we will call you to review the results.   Testing/Procedures: None ordered.    Follow-Up: At [facility], you and your health needs are our priority.  As part of our continuing mission to provide you with exceptional heart care, we have created designated Provider Care Teams.  These Care Teams include your primary Cardiologist (physician) and Advanced Practice Providers (APPs -  Physician Assistants and Nurse Practitioners) who all work together to provide you with the care you need, when you need it.  We recommend signing up for the patient portal called "MyChart".  Sign up information is provided on this After Visit Summary.  MyChart is used to connect with patients for Virtual Visits (Telemedicine).  Patients are able to view lab/test results, encounter notes, upcoming appointments, etc.  Non-urgent messages can be sent to your provider as well.   To learn more about what you can do with MyChart, go to [email or web address].    Your next appointment:   6 months with Dr Klein  Important Information About Sugar       

## 2022-09-24 NOTE — Progress Notes (Signed)
Patient ID: Calvin [name], male   DOB: Jun 16, 1951, 71 y.o.   MRN: [id number]      Patient Care Team: Hulan Fess, MD as PCP - General (Family Medicine)   HPI  Calvin [name] is a 72 y.o. male Seen in followup for asymptomatic complete heart block with LVH.  Nocturnal bradycardia with PP lengthening suggestive of hyper vagotonia and sleep apnea.  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   Continues to play tennis vigorously.  Up to 3 hours 5 days a week.  Played singles last night.  Without limitations.  Has noted his heart rate in the 120s.  Date Cr K Hgb  09/20 1.10 4.0 15.3(9/12)        DATE TEST EF%   11/13 CMRI 74%   Neg LGE  8/17 Echo  55-60 %   7/20 Echo  55-60 %          Records and Results    Past Medical History:  Diagnosis Date   Atrioventricular block, complete ([place])    Diverticulosis of colon    Left, colonic   High frequency hearing loss    Hx of adenomatous colonic polyps    Hyperlipidemia    LVH (left ventricular hypertrophy) 2011   concentric, on echocardiogram    Rheumatic fever    Skin cancer, basal cell     Past Surgical History:  Procedure Laterality Date   Arthroscopic knee surgery     Basal cell skin cancer surgery     COLONOSCOPY WITH PROPOFOL N/A 04/18/2019   Procedure: COLONOSCOPY WITH PROPOFOL;  Surgeon: Laurence Spates, MD;  Location: [facility];  Service: Endoscopy;  Laterality: N/A;    Current Outpatient Medications  Medication Sig Dispense Refill   B Complex Vitamins (B COMPLEX 100 PO) Take 100 mg by mouth as needed.      Cholecalciferol (VITAMIN D3) 10 MCG (400 UNIT) CHEW Chew 400 Units by mouth as needed.      COLLAGEN PO Take by mouth. With vitamin c daily     ECHINACEA EXTRACT PO Take 1,520 mg by mouth daily as  needed (for immune system support). 760 per capsule     ibuprofen (ADVIL) 200 MG tablet Take 200 mg by mouth every 6 (six) hours as needed (pain/inflammation.).     levothyroxine (SYNTHROID, LEVOTHROID) 112 MCG tablet Take 112 mcg by mouth daily before breakfast.      Magnesium 250 MG TABS      TURMERIC PO Take 1,000 mg by mouth daily as needed (for inflammation with activity).     vitamin C (ASCORBIC ACID) 500 MG tablet Take 500 mg by mouth 2 (two) times daily as needed (immune system boost).     Zinc 30 MG TABS      No current facility-administered medications for this visit.    No Known Allergies    Review of Systems negative except from HPI and PMH Physical Exam: BP (!) 156/84   Pulse (!) 33   Ht '6\' 1"'$  (1.854 m)   Wt 200 lb 6.4 oz (90.9 kg)   SpO2 99%   BMI 26.44 kg/m  Well developed and nourished in no acute distress HENT normal Neck supple with JVP-  flat   Clear Regular rate and rhythm, no murmurs or  gallops Abd-soft with active BS No Clubbing cyanosis edema Skin-warm and dry A & Oriented  Grossly normal sensory and motor function  ECG sinus rhythm at 55 Complete heart block with a junctional escape at 31  Assessment and  Plan  Complete heart block--narrow QRS escape  Bells Palsy   No symptoms.  My literature review was unable to find clear-cut indication for proceeding but he is increasingly inclined but for now he is satisfied with how he was doing.  We will meet again together in 6 months to discuss it.  He would obviously have an impact on his major work alternative i.e. playing tennis

## 2023-04-02 ENCOUNTER — Ambulatory Visit: Payer: 59 | Admitting: Internal Medicine

## 2023-04-02 ENCOUNTER — Encounter: Payer: Self-pay | Admitting: Internal Medicine

## 2023-04-02 VITALS — BP 136/62 | HR 32 | Ht 73.0 in | Wt 196.0 lb

## 2023-04-02 DIAGNOSIS — I442 Atrioventricular block, complete: Secondary | ICD-10-CM | POA: Diagnosis not present

## 2023-04-02 NOTE — Patient Instructions (Addendum)
Medication Instructions:  Your physician recommends that you continue on your current medications as directed. Please refer to the Current Medication list given to you today. *If you need a refill on your cardiac medications before your next appointment, please call your pharmacy*  Testing: Echocardiogram - patient made aware Your physician has requested that you have an echocardiogram. Echocardiography is a painless test that uses sound waves to create images of your heart. It provides your doctor with information about the size and shape of your heart and how well your heart's chambers and valves are working. This procedure takes approximately one hour. There are no restrictions for this procedure. Please do NOT wear cologne, perfume, aftershave, or lotions (deodorant is allowed). Please arrive 15 minutes prior to your appointment time.   Follow-Up: At [facility], you and your health needs are our priority.  As part of our continuing mission to provide you with exceptional heart care, we have created designated Provider Care Teams.  These Care Teams include your primary Cardiologist (physician) and Advanced Practice Providers (APPs -  Physician Assistants and Nurse Practitioners) who all work together to provide you with the care you need, when you need it.  We recommend signing up for the patient portal called "MyChart".  Sign up information is provided on this After Visit Summary.  MyChart is used to connect with patients for Virtual Visits (Telemedicine).  Patients are able to view lab/test results, encounter notes, upcoming appointments, etc.  Non-urgent messages can be sent to your provider as well.   To learn more about what you can do with MyChart, go to [email or web address].    Your next appointment:   3 month(s)  Provider:   Sherryl Manges, MD

## 2023-04-02 NOTE — Progress Notes (Signed)
Patient ID: Calvin [name], male   DOB: 14-Feb-1951, 72 y.o.   MRN: [id number]      Patient Care Team: Catha Gosselin, MD as PCP - General (Family Medicine)   HPI  Calvin [name] is a 72 y.o. male Seen in followup for asymptomatic complete heart block with LVH.  Nocturnal bradycardia with PP lengthening suggestive of hyper vagotonia and sleep apnea.  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   Continues to play tennis vigorously.  No episodes of lightheadedness.  No limitations.  Date Cr K Hgb  09/20 1.10 4.0 15.3(9/12)  7/23 1.08 4.9 15.8   DATE TEST EF%   11/13 [facility] 74%   Neg LGE  8/17 Echo  55-60 %   7/20 Echo  55-60 %          Records and Results    Past Medical History:  Diagnosis Date   Atrioventricular block, complete ([facility])    Diverticulosis of colon    Left, colonic   High frequency hearing loss    Hx of adenomatous colonic polyps    Hyperlipidemia    LVH (left ventricular hypertrophy) 2011   concentric, on echocardiogram    Rheumatic fever    Skin cancer, basal cell     Past Surgical History:  Procedure Laterality Date   Arthroscopic knee surgery     Basal cell skin cancer surgery     COLONOSCOPY WITH PROPOFOL N/A 04/18/2019   Procedure: COLONOSCOPY WITH PROPOFOL;  Surgeon: Carman Ching, MD;  Location: [facility];  Service: Endoscopy;  Laterality: N/A;    Current Outpatient Medications  Medication Sig Dispense Refill   B Complex Vitamins (B COMPLEX 100 PO) Take 100 mg by mouth as needed.      Cholecalciferol (VITAMIN D3) 10 MCG (400 UNIT) CHEW Chew 400 Units by mouth as needed.      COLLAGEN PO Take by mouth. With vitamin c daily     ECHINACEA EXTRACT PO Take 1,520 mg by mouth daily as needed (for immune system support). 760 per capsule      ibuprofen (ADVIL) 200 MG tablet Take 200 mg by mouth every 6 (six) hours as needed (pain/inflammation.).     levothyroxine (SYNTHROID, LEVOTHROID) 112 MCG tablet Take 112 mcg by mouth daily before breakfast.      Magnesium 250 MG TABS      TURMERIC PO Take 1,000 mg by mouth daily as needed (for inflammation with activity).     vitamin C (ASCORBIC ACID) 500 MG tablet Take 500 mg by mouth 2 (two) times daily as needed (immune system boost).     Zinc 30 MG TABS      No current facility-administered medications for this visit.    No Known Allergies    Review of Systems negative except from HPI and PMH Physical Exam: BP (!) 142/78   Pulse (!) 32   Ht 6\' 1"  (1.854 m)   Wt 196 lb (88.9 kg)   SpO2 99%   BMI 25.86 kg/m  Well developed and nourished in no acute distress HENT normal Neck supple with JVP-  flat but some candidate A waves Clear Regular rate and rhythm, no murmurs or gallops Abd-soft with active BS No Clubbing cyanosis trace edema Skin-warm and dry A & Oriented  Grossly normal sensory and motor function  ECG sinus at 60 Complete heart block with a narrow QRS escape -/09/42  Assessment and  Plan  Complete heart block--narrow QRS escape  Bells Palsy      With complete heart block, is reasonable to consider proceeding with pacing not withstanding the paucity of symptoms given his resting bradycardia.  He will ponder we will plan to revisit.  We have discussed benefits and the risks related both to the procedure but also to the potential impact on left ventricular function although this is largely mitigated now by conduction system pacing.  Will plan echocardiogram in anticipation of that visit.  Repeat blood pressure was better.  He does have some peripheral edema, I suspect that this may also be secondary and might well improve following pacing without the need for diuretics.

## 2023-06-26 ENCOUNTER — Ambulatory Visit (HOSPITAL_COMMUNITY): Payer: 59 | Attending: Cardiovascular Disease

## 2023-06-26 DIAGNOSIS — I442 Atrioventricular block, complete: Secondary | ICD-10-CM

## 2023-06-26 LAB — ECHOCARDIOGRAM COMPLETE
Area-P 1/2: 1.38 cm2
MV M vel: 4.69 m/s
MV Peak grad: 88 mm[Hg]
P 1/2 time: 1208 ms
S' Lateral: 3.1 cm

## 2023-07-07 ENCOUNTER — Ambulatory Visit: Payer: 59 | Attending: Internal Medicine | Admitting: Internal Medicine

## 2023-07-07 ENCOUNTER — Encounter: Payer: Self-pay | Admitting: Internal Medicine

## 2023-07-07 VITALS — BP 132/80 | HR 34 | Ht 73.0 in | Wt 200.0 lb

## 2023-07-07 DIAGNOSIS — I442 Atrioventricular block, complete: Secondary | ICD-10-CM | POA: Diagnosis not present

## 2023-07-07 DIAGNOSIS — Z01812 Encounter for preprocedural laboratory examination: Secondary | ICD-10-CM

## 2023-07-07 NOTE — Progress Notes (Signed)
Patient ID: Calvin [name], male   DOB: 10-25-1950, 72 y.o.   MRN: [id number]      Patient Care Team: Catha Gosselin, MD as PCP - General (Family Medicine)   HPI  Calvin [name] is a 72 y.o. male Seen in followup for asymptomatic complete heart block with LVH.  Nocturnal bradycardia with PP lengthening suggestive of hyper vagotonia and sleep apnea.  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   The patient denies chest pain, shortness of breath, nocturnal dyspnea, orthopnea or peripheral edema.  There have been no palpitations, lightheadedness or syncope.    Has had interval gout.  Date Cr K Hgb  09/20 1.10 4.0 15.3(9/12)  7/23 1.08 4.9 15.8   DATE TEST EF%   11/13 [facility]RI 74%   Neg LGE  8/17 Echo  55-60 %   7/20 Echo  55-60 %   10/24 Echo  55-60%      Records and Results    Past Medical History:  Diagnosis Date   Atrioventricular block, complete ([facility])    Diverticulosis of colon    Left, colonic   High frequency hearing loss    Hx of adenomatous colonic polyps    Hyperlipidemia    LVH (left ventricular hypertrophy) 2011   concentric, on echocardiogram    Rheumatic fever    Skin cancer, basal cell     Past Surgical History:  Procedure Laterality Date   Arthroscopic knee surgery     Basal cell skin cancer surgery     COLONOSCOPY WITH PROPOFOL N/A 04/18/2019   Procedure: COLONOSCOPY WITH PROPOFOL;  Surgeon: Carman Ching, MD;  Location: [facility];  Service: Endoscopy;  Laterality: N/A;    Current Outpatient Medications  Medication Sig Dispense Refill   B Complex Vitamins (B COMPLEX 100 PO) Take 100 mg by mouth as needed.      Cholecalciferol (D3 2000 PO) 1 per daily     colchicine 0.6 MG tablet Take 0.6 mg by mouth as needed.     COLLAGEN PO Take by  mouth. With vitamin c daily     ECHINACEA EXTRACT PO Take 1,520 mg by mouth daily as needed (for immune system support). 760 per capsule     ibuprofen (ADVIL) 200 MG tablet Take 200 mg by mouth every 6 (six) hours as needed (pain/inflammation.).     levothyroxine (SYNTHROID, LEVOTHROID) 112 MCG tablet Take 112 mcg by mouth daily before breakfast.      Magnesium 250 MG TABS      TURMERIC PO Take 1,000 mg by mouth daily as needed (for inflammation with activity).     vitamin C (ASCORBIC ACID) 500 MG tablet Take 500 mg by mouth 2 (two) times daily as needed (immune system boost).     Zinc 30 MG TABS      No current facility-administered medications for this visit.    Allergies  Allergen Reactions   Valacyclovir Photosensitivity      Review of Systems negative except from HPI and PMH Physical Exam: BP 132/80   Pulse (!) 34   Ht 6\' 1"  (1.854 m)   Wt 200 lb (90.7 kg)   BMI 26.39 kg/m  Well developed and nourished in no acute distress HENT normal Neck supple with JVP-  flat   Clear  slow rate and rhythm, no murmurs or gallops Abd-soft with active BS No Clubbing cyanosis edema Skin-warm and dry A & Oriented  Grossly normal sensory and motor function  ECG sinus at 60 with complete heart block and a ventricular rate of 34 with right bundle branch block  Assessment and  Plan  Complete heart block-  New right bundle branch block  Bells Palsy      With adult onset complete heart block, we have discussed in the past pacing but have put it off because of paucity of symptoms.  Now however there is evidence of progressive conduction system disease and involvement of the His-Purkinje system with new onset right bundle branch block.  Have reviewed with Dr. Leonia Reeves and the patient, and in concert have elected to proceed with dual-chamber pacing.  The benefits and risks were reviewed including but not limited to death,  perforation, infection, lead dislodgement and device malfunction.  The  patient understands agrees and is willing to proceed.  The issue of heart block and a 72 year old remains unexplained.  The cMRI in 2013 was negative, we will repeat it as if there were evidence of sarcoid anti-inflammatory therapies would be indicated.

## 2023-07-07 NOTE — Patient Instructions (Addendum)
Medication Instructions:  Your physician recommends that you continue on your current medications as directed. Please refer to the Current Medication list given to you today.  *If you need a refill on your cardiac medications before your next appointment, please call your pharmacy*   Lab Work: None ordered.    If you have labs (blood work) drawn today and your tests are completely normal, you will receive your results only by: MyChart Message (if you have MyChart) OR A paper copy in the mail If you have any lab test that is abnormal or we need to change your treatment, we will call you to review the results.   Testing/Procedures: Your physician has requested that you have a cardiac MRI. Cardiac MRI uses a computer to create images of your heart as its beating, producing both still and moving pictures of your heart and major blood vessels. For further information please visit [email or web address]. Please follow the instruction sheet given to you today for more information.     Follow-Up: At [facility], you and your health needs are our priority.  As part of our continuing mission to provide you with exceptional heart care, we have created designated Provider Care Teams.  These Care Teams include your primary Cardiologist (physician) and Advanced Practice Providers (APPs -  Physician Assistants and Nurse Practitioners) who all work together to provide you with the care you need, when you need it.  We recommend signing up for the patient portal called "MyChart".  Sign up information is provided on this After Visit Summary.  MyChart is used to connect with patients for Virtual Visits (Telemedicine).  Patients are able to view lab/test results, encounter notes, upcoming appointments, etc.  Non-urgent messages can be sent to your provider as well.   To learn more about what you can do with MyChart, go to [email or web address].    Your next appointment:   To be determined

## 2023-07-16 ENCOUNTER — Telehealth: Payer: Self-pay | Admitting: Internal Medicine

## 2023-07-16 DIAGNOSIS — I442 Atrioventricular block, complete: Secondary | ICD-10-CM

## 2023-07-16 DIAGNOSIS — Z01812 Encounter for preprocedural laboratory examination: Secondary | ICD-10-CM

## 2023-07-16 NOTE — Telephone Encounter (Signed)
Patient would like to know if he can get his MRI sooner. He talked to Dr. Graciela Husbands about getting a pacemaker implanted and he is wanting to get that done as soon as possible. His MRI is scheduled for 12/27 and would like to know if it can be done sooner. I gave him the number for scheduling to discuss. He would like to know if the MRI is necessary prior to having the pacemaker implanted. Advised that it is possible that it could help decide what kind of device that Dr. Graciela Husbands wanted to implant but I will check with his nurse to see. He states if MRI is not necessary that he would like to go ahead and schedule pacemaker implant however he understands that if MRI is needed prior that is fine.

## 2023-07-16 NOTE — Telephone Encounter (Signed)
Patient has questions about MRI being moved up due to Pacemaker Procedure. Please Advise

## 2023-07-17 NOTE — Telephone Encounter (Signed)
Spoke with pt who is requesting to go ahead and be scheduled for device implant due to being end of the year and having to restart with insurance deductible.  Pt's cMRI has been scheduled for 09/18/2023.  Pt had requested to be placed on waiting list but was advised that there is not a waiting list for cMRI. Pt advised will speak with Dr Graciela Husbands to see if he is willing to proceed prior to cMRI.  Pt verbalizes understanding and thanked Charity fundraiser for the call.

## 2023-07-22 NOTE — Telephone Encounter (Signed)
Have reached out to [facility] and Dr Scharlene Gloss who is working on this

## 2023-07-22 NOTE — Telephone Encounter (Signed)
Spoke with pt to update him FI:EPPI scheduling.  Pt advised Dr Graciela Husbands has requested cMRI be moved up so pt may be scheduled for device implant before the end of 2024.  Pt verbalizes understanding and thanked Charity fundraiser for the call.

## 2023-07-23 ENCOUNTER — Other Ambulatory Visit (HOSPITAL_COMMUNITY): Payer: Self-pay | Admitting: *Deleted

## 2023-07-23 ENCOUNTER — Telehealth (HOSPITAL_COMMUNITY): Payer: Self-pay | Admitting: *Deleted

## 2023-07-23 DIAGNOSIS — Z01812 Encounter for preprocedural laboratory examination: Secondary | ICD-10-CM

## 2023-07-23 DIAGNOSIS — I442 Atrioventricular block, complete: Secondary | ICD-10-CM

## 2023-07-23 NOTE — Telephone Encounter (Signed)
Reaching out to patient to offer assistance regarding upcoming cardiac imaging study; pt verbalizes understanding of appt date/time, parking situation and where to check in, and verified current allergies; name and call back number provided for further questions should they arise ? ?Mansfield Dann RN Navigator Cardiac Imaging ?[facility] ?[phone] office ?[phone] cell ? ?Patient denies metal or claustrophobia. ?

## 2023-07-24 ENCOUNTER — Other Ambulatory Visit: Payer: Self-pay | Admitting: Internal Medicine

## 2023-07-24 ENCOUNTER — Ambulatory Visit (HOSPITAL_COMMUNITY)
Admission: RE | Admit: 2023-07-24 | Discharge: 2023-07-24 | Disposition: A | Discharge status: Home / Self Care | Payer: 59 | Source: Ambulatory Visit | Attending: Internal Medicine | Admitting: Internal Medicine

## 2023-07-24 DIAGNOSIS — I442 Atrioventricular block, complete: Secondary | ICD-10-CM | POA: Insufficient documentation

## 2023-07-24 LAB — HEMOGLOBIN AND HEMATOCRIT, BLOOD
Hematocrit: 48.4 % (ref 37.5–51.0)
Hemoglobin: 16.5 g/dL (ref 13.0–17.7)

## 2023-07-24 MED ORDER — GADOBUTROL 1 MMOL/ML IV SOLN
10.0000 mL | Freq: Once | INTRAVENOUS | Status: AC | PRN
  Administered 2023-07-24: 10 mL via INTRAVENOUS

## 2023-07-24 NOTE — Addendum Note (Signed)
Addended by: Alois Cliche on: 07/24/2023 08:55 AM   Modules accepted: Orders

## 2023-07-24 NOTE — Telephone Encounter (Signed)
Spoke with pt who had cMRI completed this morning.  Pt advised PPM implant has been scheduled for 08/24/23.  Reviewed pt's instructions that have been placed on pt's MyChart.  Pt verbalizes understanding and thanked Charity fundraiser for the call.

## 2023-07-29 ENCOUNTER — Telehealth: Payer: Self-pay | Admitting: Internal Medicine

## 2023-07-29 NOTE — Telephone Encounter (Signed)
Pt is requesting a callback regarding the MRI he had done. Please advise

## 2023-07-30 NOTE — Telephone Encounter (Signed)
Spoke with pt who states he received a letter from his insurance company regarding authorization for cMRI.  Pt advised authorization was received and approved through 12/2023.  Pt advised if he has any further billing issues regarding cMRI he may want to reach out to [facility] billing.  Pt verbalizes understanding and thanked Charity fundraiser for the call.

## 2023-08-07 ENCOUNTER — Encounter: Payer: Self-pay | Admitting: Internal Medicine

## 2023-08-17 ENCOUNTER — Other Ambulatory Visit: Payer: Self-pay | Admitting: Internal Medicine

## 2023-08-18 LAB — CBC
Hematocrit: 48.8 % (ref 37.5–51.0)
Hemoglobin: 16.2 g/dL (ref 13.0–17.7)
MCH: 32.1 pg (ref 26.6–33.0)
MCHC: 33.2 g/dL (ref 31.5–35.7)
MCV: 97 fL (ref 79–97)
Platelets: 150 10*3/uL (ref 150–450)
RBC: 5.05 x10E6/uL (ref 4.14–5.80)
RDW: 13.6 % (ref 11.6–15.4)
WBC: 6.7 10*3/uL (ref 3.4–10.8)

## 2023-08-18 LAB — BASIC METABOLIC PANEL
BUN/Creatinine Ratio: 13 (ref 10–24)
BUN: 15 mg/dL (ref 8–27)
CO2: 25 mmol/L (ref 20–29)
Calcium: 9.6 mg/dL (ref 8.6–10.2)
Chloride: 102 mmol/L (ref 96–106)
Creatinine, Ser: 1.12 mg/dL (ref 0.76–1.27)
Glucose: 65 mg/dL — ABNORMAL LOW (ref 70–99)
Potassium: 4.6 mmol/L (ref 3.5–5.2)
Sodium: 141 mmol/L (ref 134–144)
eGFR: 69 mL/min/{1.73_m2} (ref >59)

## 2023-08-21 NOTE — Pre-Procedure Instructions (Signed)
Attempted to call patient regarding procedure instructions for Monday.  Unable to leave a message as the mailbox is full.  Instructions are as follows: Arrival time 0530 Nothing to eat or drink after midnight Ok to take morning medications with sips of water. Responsible person to drive you home and stay with you for 24 hrs Wash with special soap night before and morning of procedure

## 2023-08-24 ENCOUNTER — Ambulatory Visit (HOSPITAL_BASED_OUTPATIENT_CLINIC_OR_DEPARTMENT_OTHER): Payer: 59

## 2023-08-24 ENCOUNTER — Encounter (HOSPITAL_COMMUNITY)
Admission: RE | Disposition: A | Discharge status: Home / Self Care | Payer: Self-pay | Source: Home / Self Care | Attending: Internal Medicine

## 2023-08-24 ENCOUNTER — Ambulatory Visit (HOSPITAL_COMMUNITY)
Admission: RE | Admit: 2023-08-24 | Discharge: 2023-08-24 | Disposition: A | Discharge status: Home / Self Care | Payer: 59 | Attending: Internal Medicine | Admitting: Internal Medicine

## 2023-08-24 ENCOUNTER — Other Ambulatory Visit: Payer: Self-pay

## 2023-08-24 DIAGNOSIS — I442 Atrioventricular block, complete: Secondary | ICD-10-CM | POA: Insufficient documentation

## 2023-08-24 DIAGNOSIS — L03116 Cellulitis of left lower limb: Secondary | ICD-10-CM | POA: Diagnosis not present

## 2023-08-24 DIAGNOSIS — G510 Bell's palsy: Secondary | ICD-10-CM | POA: Insufficient documentation

## 2023-08-24 DIAGNOSIS — I10 Essential (primary) hypertension: Secondary | ICD-10-CM | POA: Insufficient documentation

## 2023-08-24 DIAGNOSIS — M7989 Other specified soft tissue disorders: Secondary | ICD-10-CM | POA: Diagnosis not present

## 2023-08-24 DIAGNOSIS — Z539 Procedure and treatment not carried out, unspecified reason: Secondary | ICD-10-CM | POA: Diagnosis not present

## 2023-08-24 DIAGNOSIS — M79662 Pain in left lower leg: Secondary | ICD-10-CM | POA: Diagnosis not present

## 2023-08-24 DIAGNOSIS — M109 Gout, unspecified: Secondary | ICD-10-CM | POA: Insufficient documentation

## 2023-08-24 SURGERY — PACEMAKER IMPLANT

## 2023-08-24 MED ORDER — CEFAZOLIN SODIUM-DEXTROSE 2-4 GM/100ML-% IV SOLN: 2.0000 g | INTRAVENOUS | Status: DC

## 2023-08-24 MED ORDER — GENTAMICIN SULFATE 40 MG/ML IJ SOLN: 80.0000 mg | INTRAMUSCULAR | Status: DC

## 2023-08-24 MED ORDER — SODIUM CHLORIDE 0.9 % IV SOLN: INTRAVENOUS | Status: DC

## 2023-08-24 MED ORDER — CHLORHEXIDINE GLUCONATE 4 % EX SOLN
4.0000 | Freq: Once | CUTANEOUS | Status: DC
Start: 2023-08-24 — End: 2023-08-24
  Filled 2023-08-24: qty 60

## 2023-08-24 NOTE — H&P (Addendum)
Patient Care Team: Catha Gosselin, MD as PCP - General (Family Medicine)   HPI  Calvin [name] is a 72 y.o. male admitted for mildly symptomatic complete heart block with LVH.  Nocturnal bradycardia with PP lengthening suggestive of hyper vagotonia and sleep apnea.  Underwent a sleep study 12/20 ([facility] 11)  which was abnormal and a repeat sleep study was to be done   Bells palsy is impacting his exercise over the years with temporally related challenges with balance not withstanding as he continues to play tennis.   Notes swelling and discomfort of his L leg; no recent travel     Has had interval gout.   Date Cr K Hgb  09/20 1.10 4.0 15.3(9/12)  7/23 1.08 4.9 15.8  11/24 1.12 4.6 16.2    DATE TEST EF%    11/13 CMRI 74%   Neg LGE  8/17 Echo  55-60 %    7/20 Echo  55-60 %    10/24 Echo  55-60%         Records and Results Reviewed   Past Medical History:  Diagnosis Date   Atrioventricular block, complete (HCC)    Diverticulosis of colon    Left, colonic   High frequency hearing loss    Hx of adenomatous colonic polyps    Hyperlipidemia    LVH (left ventricular hypertrophy) 2011   concentric, on echocardiogram    Rheumatic fever    Skin cancer, basal cell     Past Surgical History:  Procedure Laterality Date   Arthroscopic knee surgery     Basal cell skin cancer surgery     COLONOSCOPY WITH PROPOFOL N/A 04/18/2019   Procedure: COLONOSCOPY WITH PROPOFOL;  Surgeon: Carman Ching, MD;  Location: [facility];  Service: Endoscopy;  Laterality: N/A;    Current Facility-Administered Medications  Medication Dose Route Frequency Provider Last Rate Last Admin   0.9 %  sodium chloride infusion   Intravenous Continuous Duke Salvia, MD 50 mL/hr at 08/24/23 0550 New Bag at 08/24/23 0550   ceFAZolin (ANCEF) IVPB 2g/100 mL premix  2 g Intravenous On Call Duke Salvia, MD       chlorhexidine (HIBICLENS) 4 % liquid 4 Application  4 Application Topical Once Duke Salvia, MD       gentamicin (GARAMYCIN) 80 mg in sodium chloride 0.9 % 500 mL irrigation  80 mg Irrigation On Call Duke Salvia, MD        Allergies  Allergen Reactions   Valtrex [Valacyclovir] Photosensitivity      Social History   Tobacco Use   Smoking status: Former   Smokeless tobacco: Never  Vaping Use   Vaping status: Never Used  Substance Use Topics   Alcohol use: Yes    Alcohol/week: 10.0 standard drinks of alcohol    Types: 10 Cans of beer per week    Comment: weekly   Drug use: No     Family History  Problem Relation Age of Onset   Bone cancer Mother        Polio   Hyperlipidemia Father      Current Meds  Medication Sig   B Complex Vitamins (B COMPLEX 100 PO) Take 1 capsule by mouth in the morning.   carboxymethylcellulose (REFRESH PLUS) 0.5 % SOLN Place 1-2 drops into both eyes in the morning and at bedtime.   Cholecalciferol (D3 2000 PO) Take 2,000 Units by mouth in the morning. 1 per daily  COLLAGEN PO Take 10-20 g by mouth every Monday, Tuesday, Wednesday, Thursday, and Friday.   ECHINACEA EXTRACT PO Take 1,520 mg by mouth daily as needed (for immune system support). 760 per capsule   ibuprofen (ADVIL) 200 MG tablet Take 200 mg by mouth every 8 (eight) hours as needed (pain/inflammation.).   levothyroxine (SYNTHROID, LEVOTHROID) 112 MCG tablet Take 112 mcg by mouth daily before breakfast.    Magnesium 250 MG TABS Take 250 mg by mouth in the morning.   Multiple Vitamins-Minerals (ZINC PO) Take 50 mg by mouth in the morning.   TURMERIC PO Take 1,000 mg by mouth daily as needed (for inflammation with activity).   vitamin C (ASCORBIC ACID) 500 MG tablet Take 500 mg by mouth 2 (two) times daily as needed (immune system boost).     Review of Systems negative except from HPI and PMH  Physical Exam BP (!) 176/71   Pulse (!) 37   Temp 98.2 F (36.8 C)   Ht 6\' 1"  (1.854 m)   Wt 86.6 kg   SpO2 100%   BMI 25.20 kg/m  Well developed and well  nourished in no acute distress HENT normal E scleral and icterus clear Neck Supple JVP flat; carotids brisk and full Clear to ausculation Slow Regular rate and rhythm, no murmurs gallops or rub Soft with active bowel sounds No clubbing cyanosis 1+ Edema on left with eythema calor and some tenderness  Tenderness and swelling of the left fore foot with diffuse erythema Alert and oriented, grossly normal motor and sensory function Skin Warm and Dry    Assessment and  Plan Complete heart block-   New right bundle branch block   Bells Palsy     L leg swelling L forefoot lesion   Hypertension --     For pacemaker implantation for relief of symptoms for complete heart block  However with L leg swelling need to R/O DVT as the anticoagulation would make problematic the procedure -- if neg willneed further evaluation to distinguish recurrent gout from cellulitis  will defer procedure

## 2023-08-25 ENCOUNTER — Encounter: Payer: Self-pay | Admitting: Internal Medicine

## 2023-09-01 ENCOUNTER — Telehealth: Payer: Self-pay

## 2023-09-01 NOTE — Telephone Encounter (Signed)
Spoke with pt who is agreeable to 09/09/2023 implant date proposed by Dr Graciela Husbands.  Pt advised will contact to review instructions once implant is scheduled.  Pt verbalizes understanding and thanked Charity fundraiser for the call.

## 2023-09-04 ENCOUNTER — Encounter: Payer: Self-pay | Admitting: Internal Medicine

## 2023-09-08 NOTE — Telephone Encounter (Signed)
Spoke with pt and advised per Dr Graciela Husbands [facility] implant scheduled for 09/09/2023 should be canceled due to possible foot infection.  Pt verbalizes understanding and agrees with plan.  He will keep Dr Graciela Husbands updated so implant can be rescheduled at a later date.

## 2023-09-09 ENCOUNTER — Ambulatory Visit (HOSPITAL_COMMUNITY): Admission: RE | Admit: 2023-09-09 | Payer: 59 | Source: Home / Self Care | Admitting: Internal Medicine

## 2023-09-09 ENCOUNTER — Encounter (HOSPITAL_COMMUNITY): Admission: RE | Payer: Self-pay | Source: Home / Self Care

## 2023-09-09 ENCOUNTER — Ambulatory Visit: Payer: 59

## 2023-09-09 SURGERY — PACEMAKER IMPLANT

## 2023-09-18 ENCOUNTER — Other Ambulatory Visit (HOSPITAL_COMMUNITY): Payer: 59

## 2023-09-24 ENCOUNTER — Ambulatory Visit: Payer: 59 | Admitting: Pulmonary Disease

## 2023-10-08 ENCOUNTER — Telehealth: Payer: Self-pay | Admitting: Internal Medicine

## 2023-10-08 NOTE — Telephone Encounter (Signed)
Pt is requesting a callback regarding him needing a time frame for when he can get scheduled for his implant again. Please advise

## 2023-10-08 NOTE — Telephone Encounter (Signed)
Spoke with pt and advised he will need APP appt to clear for implant rescheduling.  Will have scheduling contact pt.  Pt verbalizes understanding and agrees with current plan.

## 2023-10-09 NOTE — Progress Notes (Unsigned)
  Electrophysiology Office Note:   Date:  10/12/2023  ID:  JAHNI TROAST, DOB 1950-11-09, MRN [id number]  Primary Cardiologist: None Electrophysiologist: Dr. Graciela Husbands     History of Present Illness:   Calvin [name] is a 73 y.o. male with h/o mildly symptomatic CHB with LVH, nocturnal bradycardia with P-P lengthening suggestive of hypervagotonia and sleep apnea seen today for routine electrophysiology followup.   Was seen 12/2 for scheduled PPM but noted to have swelling and discomfort of his L leg. Device was deferred for work up. DVT US negative. Possibly interval gout.   Since last being seen in our clinic the patient reports doing well. Swelling resolved both with time and round of Keflex. He remains fatigue, hasn't exercised in approx 1 month. He  denies chest pain, palpitations, dyspnea, PND, orthopnea, nausea, vomiting, dizziness, syncope, edema, weight gain, or early satiety.   Review of systems complete and found to be negative unless listed in HPI.   EP Information / Studies Reviewed:    EKG is ordered today. Personal review as below.  EKG Interpretation Date/Time:  Monday October 12 2023 08:15:26 EST Ventricular Rate:  33 PR Interval:    QRS Duration:  146 QT Interval:  570 QTC Calculation: 421 R Axis:   61  Text Interpretation: Complete Heart Block (Sinus) Idioventricular rhythm Right bundle branch block When compared with ECG of 07-Jul-2023 14:56, No significant change was found Confirmed by Maxine Glenn [phone]) on 10/12/2023 8:24:32 AM      Physical Exam:   VS:  BP (!) 158/80 (BP Location: Left Arm, Patient Position: Sitting, Cuff Size: Large)   Pulse (!) 33   Ht 6\' 1"  (1.854 m)   Wt 191 lb (86.6 kg)   SpO2 97%   BMI 25.20 kg/m    Wt Readings from Last 3 Encounters:  10/12/23 191 lb (86.6 kg)  08/24/23 191 lb (86.6 kg)  07/07/23 200 lb (90.7 kg)     GEN: No acute distress NECK: No JVD; No carotid bruits CARDIAC: Slow but regular, no murmurs, rubs,  gallops RESPIRATORY:  Clear to auscultation without rales, wheezing or rhonchi  ABDOMEN: Soft, non-tender, non-distended EXTREMITIES:  No edema; No deformity   ASSESSMENT AND PLAN:    CHB RBBB EKG today shows ongoing CHB at 33 bpm With progressive conduction system disease, Explained risks, benefits, and alternatives to PPM implantation, including but not limited to bleeding, infection, pneumothorax, pericardial effusion, lead dislodgement, heart attack, stroke, or death.  Pt verbalized understanding and agrees to proceed.      Leg swelling He cites also have sprained his ankle around the same time.  Resolved with Keflex and rest.  No further.   HTN Stable on current regimen     Follow up with Dr. Graciela Husbands as usual post procedure  Signed, Graciella Freer, PA-C

## 2023-10-12 ENCOUNTER — Ambulatory Visit: Payer: 59 | Attending: Student | Admitting: Student

## 2023-10-12 ENCOUNTER — Encounter: Payer: Self-pay | Admitting: Student

## 2023-10-12 ENCOUNTER — Encounter: Payer: Self-pay | Admitting: *Deleted

## 2023-10-12 VITALS — BP 158/80 | HR 33 | Ht 73.0 in | Wt 191.0 lb

## 2023-10-12 DIAGNOSIS — Z01812 Encounter for preprocedural laboratory examination: Secondary | ICD-10-CM | POA: Diagnosis not present

## 2023-10-12 DIAGNOSIS — G4730 Sleep apnea, unspecified: Secondary | ICD-10-CM

## 2023-10-12 DIAGNOSIS — I442 Atrioventricular block, complete: Secondary | ICD-10-CM

## 2023-10-12 NOTE — Patient Instructions (Signed)
Medication Instructions:  Your physician recommends that you continue on your current medications as directed. Please refer to the Current Medication list given to you today.  *If you need a refill on your cardiac medications before your next appointment, please call your pharmacy*  Lab Work: BMET, CBC-TODAY If you have labs (blood work) drawn today and your tests are completely normal, you will receive your results only by: MyChart Message (if you have MyChart) OR A paper copy in the mail If you have any lab test that is abnormal or we need to change your treatment, we will call you to review the results.   Testing/Procedures: See letter   Follow-Up: At [facility], you and your health needs are our priority.  As part of our continuing mission to provide you with exceptional heart care, we have created designated Provider Care Teams.  These Care Teams include your primary Cardiologist (physician) and Advanced Practice Providers (APPs -  Physician Assistants and Nurse Practitioners) who all work together to provide you with the care you need, when you need it.  Your next appointment:   Follow up appointments will be scheduled for you and print out on your hospital discharge summary after your procedure.

## 2023-10-13 LAB — BASIC METABOLIC PANEL
BUN/Creatinine Ratio: 11 (ref 10–24)
BUN: 12 mg/dL (ref 8–27)
CO2: 24 mmol/L (ref 20–29)
Calcium: 9.5 mg/dL (ref 8.6–10.2)
Chloride: 104 mmol/L (ref 96–106)
Creatinine, Ser: 1.09 mg/dL (ref 0.76–1.27)
Glucose: 93 mg/dL (ref 70–99)
Potassium: 4.7 mmol/L (ref 3.5–5.2)
Sodium: 142 mmol/L (ref 134–144)
eGFR: 72 mL/min/{1.73_m2} (ref >59)

## 2023-10-13 LAB — CBC
Hematocrit: 48 % (ref 37.5–51.0)
Hemoglobin: 16.3 g/dL (ref 13.0–17.7)
MCH: 32.5 pg (ref 26.6–33.0)
MCHC: 34 g/dL (ref 31.5–35.7)
MCV: 96 fL (ref 79–97)
Platelets: 202 10*3/uL (ref 150–450)
RBC: 5.02 x10E6/uL (ref 4.14–5.80)
RDW: 12.6 % (ref 11.6–15.4)
WBC: 5.9 10*3/uL (ref 3.4–10.8)

## 2023-10-26 ENCOUNTER — Encounter: Payer: Self-pay | Admitting: Internal Medicine

## 2023-10-28 ENCOUNTER — Other Ambulatory Visit (HOSPITAL_COMMUNITY): Payer: Self-pay | Admitting: Family Medicine

## 2023-10-28 DIAGNOSIS — M25572 Pain in left ankle and joints of left foot: Secondary | ICD-10-CM

## 2023-10-29 NOTE — Pre-Procedure Instructions (Signed)
 Attempted to call patient regarding procedure instructions.  Voicemail full.  Instructed are: Arrival time 1130 Nothing to eat or drink after midnight No meds AM of procedure Responsible person to drive you home and stay with you for 24 hrs Wash with special soap night before and morning of procedure

## 2023-10-30 ENCOUNTER — Telehealth: Payer: Self-pay

## 2023-10-30 ENCOUNTER — Ambulatory Visit (HOSPITAL_COMMUNITY)
Admission: RE | Admit: 2023-10-30 | Discharge: 2023-10-30 | Disposition: A | Discharge status: Home / Self Care | Payer: 59 | Attending: Internal Medicine | Admitting: Internal Medicine

## 2023-10-30 ENCOUNTER — Other Ambulatory Visit: Payer: Self-pay

## 2023-10-30 ENCOUNTER — Encounter (HOSPITAL_COMMUNITY)
Admission: RE | Disposition: A | Discharge status: Home / Self Care | Payer: Self-pay | Source: Home / Self Care | Attending: Internal Medicine

## 2023-10-30 DIAGNOSIS — I442 Atrioventricular block, complete: Secondary | ICD-10-CM | POA: Insufficient documentation

## 2023-10-30 DIAGNOSIS — I10 Essential (primary) hypertension: Secondary | ICD-10-CM | POA: Diagnosis not present

## 2023-10-30 DIAGNOSIS — Z539 Procedure and treatment not carried out, unspecified reason: Secondary | ICD-10-CM | POA: Diagnosis not present

## 2023-10-30 SURGERY — PACEMAKER IMPLANT

## 2023-10-30 MED ORDER — CEFAZOLIN SODIUM-DEXTROSE 2-4 GM/100ML-% IV SOLN: 2.0000 g | INTRAVENOUS | Status: DC

## 2023-10-30 MED ORDER — SODIUM CHLORIDE 0.9% FLUSH: 3.0000 mL | INTRAVENOUS | Status: DC | PRN

## 2023-10-30 MED ORDER — CHLORHEXIDINE GLUCONATE 4 % EX SOLN
4.0000 | Freq: Once | CUTANEOUS | Status: DC
  Filled 2023-10-30: qty 60

## 2023-10-30 MED ORDER — SODIUM CHLORIDE 0.9 % IV SOLN
INTRAVENOUS | Status: AC
  Filled 2023-10-30: qty 2

## 2023-10-30 MED ORDER — POVIDONE-IODINE 10 % EX SWAB
2.0000 | Freq: Once | CUTANEOUS | Status: AC
  Administered 2023-10-30: 2 via TOPICAL

## 2023-10-30 MED ORDER — SODIUM CHLORIDE 0.9 % IV SOLN: INTRAVENOUS | Status: DC

## 2023-10-30 MED ORDER — SODIUM CHLORIDE 0.9% FLUSH: 3.0000 mL | Freq: Two times a day (BID) | INTRAVENOUS | Status: DC

## 2023-10-30 MED ORDER — CEFAZOLIN SODIUM-DEXTROSE 2-4 GM/100ML-% IV SOLN
INTRAVENOUS | Status: AC
  Filled 2023-10-30: qty 100

## 2023-10-30 MED ORDER — SODIUM CHLORIDE 0.9 % IV SOLN: 80.0000 mg | INTRAVENOUS | Status: DC

## 2023-10-30 MED ORDER — LIDOCAINE HCL 1 % IJ SOLN
INTRAMUSCULAR | Status: AC
  Filled 2023-10-30: qty 60

## 2023-10-30 NOTE — Interval H&P Note (Signed)
 History and Physical Interval Note:  10/30/2023 12:13 PM  Calvin [name]  has presented today for surgery, with the diagnosis of heart block.  The various methods of treatment have been discussed with the patient and family. After consideration of risks, benefits and other options for treatment, the patient has consented to  Procedure(s): PACEMAKER IMPLANT (N/A) as a surgical intervention.  The patient's history has been reviewed, patient examined, no change in status, stable for surgery.  I have reviewed the patient's chart and labs.  Questions were answered to the patient's satisfaction.     Elspeth Sage

## 2023-10-30 NOTE — Telephone Encounter (Signed)
 Spoke with pt and advised per Dr Rodolfo Clan Dr Adah Acron Snider's office will be contact him.   Pt states his PPM implant was canceled today for possible infection of his ankle.  Pt verbalizes understanding and thanked Charity fundraiser for the call.

## 2023-10-30 NOTE — Interval H&P Note (Signed)
 History and Physical Interval Note:  10/30/2023 6:06 PM  Calvin [name]  has presented today for surgery, with the diagnosis of heart block.  The various methods of treatment have been discussed with the patient and family. After consideration of risks, benefits and other options for treatment, the patient has consented to  Procedure(s): PACEMAKER IMPLANT (N/A) as a surgical intervention.  The patient's history has been reviewed, patient examined, no change in status, stable for surgery.  I have reviewed the patient's chart and labs.  Questions were answered to the patient's satisfaction.     Elspeth Sage   Seen  Lefft lateral foot and distal lateral shin red and swolledn Will not proceed   Have spoken with ID and they will see and help get to the bottom of this relapsing erythema and calor; concerned about deep infection  Some pain with joint motion but not exquisitely so

## 2023-11-02 ENCOUNTER — Encounter: Payer: Self-pay | Admitting: Internal Medicine

## 2023-11-04 ENCOUNTER — Encounter: Payer: Self-pay | Admitting: Family Medicine

## 2023-11-12 ENCOUNTER — Ambulatory Visit: Payer: 59

## 2023-11-12 NOTE — Telephone Encounter (Signed)
 Pt is scheduled to see Dr Drue Second on 11/17/2023.

## 2023-11-17 ENCOUNTER — Other Ambulatory Visit: Payer: Self-pay

## 2023-11-17 ENCOUNTER — Encounter: Payer: Self-pay | Admitting: Internal Medicine

## 2023-11-17 ENCOUNTER — Ambulatory Visit (INDEPENDENT_AMBULATORY_CARE_PROVIDER_SITE_OTHER): Payer: 59 | Admitting: Internal Medicine

## 2023-11-17 VITALS — HR 35 | Temp 97.4°F

## 2023-11-17 DIAGNOSIS — M25472 Effusion, left ankle: Secondary | ICD-10-CM | POA: Diagnosis not present

## 2023-11-17 NOTE — Progress Notes (Signed)
 RFV: referral from dr Graciela Husbands to evaluate for recurrent cellulitis  Patient ID: Calvin [name], male   DOB: [birth year], 73 y.o.   MRN: [id number]  HPI Calvin [name] is being evaluated for PPM placement but has had to have 2 appointment cancelled due to recurrence of cellulitis to left ankle. He reports that he Plays tennis , and recently noticed having left ankle was swollen after playing; h e remembered having pain to left tendon... he had mri that showed tendinosis/tendinitis. He was initially treated for gout, then subsequently cellulitis  x 5 days with cephalexin- but unsure if it improved. 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 Doesn't recall trauma to left foot. No recent fares in February.   He is now being evaluated for 1st PPM  by dr Graciela Husbands who wants to make sure that he doesn't have infection     Outpatient Encounter Medications as of 11/17/2023  Medication Sig   acidophilus (RISAQUAD) CAPS capsule Take 1 capsule by mouth daily.   B Complex-C (B-COMPLEX WITH VITAMIN C) tablet Take 1 tablet by mouth daily.   Bacillus Coagulans-Inulin (PROBIOTIC) 1-250 BILLION-MG CAPS    Carboxymethylcellul-Glycerin (REFRESH TEARS PF OP) Place 1 drop into both eyes as needed (dry eyes).   Cholecalciferol (VITAMIN D-3) 125 MCG (5000 UT) TABS Take 5,000 Units by mouth daily.   ECHINACEA EXTRACT PO Take 760 mg by mouth daily.   ibuprofen (ADVIL) 200 MG tablet Take 200 mg by mouth every 8 (eight) hours as needed (pain/inflammation.).   levothyroxine (SYNTHROID, LEVOTHROID) 112 MCG tablet Take 112 mcg by mouth daily before breakfast.    Magnesium 250 MG TABS Take 250 mg by mouth in the morning.   TURMERIC PO Take 500 mg by mouth daily.   vitamin C (ASCORBIC ACID) 500 MG tablet Take 500 mg by mouth daily.   zinc gluconate 50 MG tablet Take 50 mg by mouth daily.   No facility-administered encounter medications on file as  of 11/17/2023.     Patient Active Problem List   Diagnosis Date Noted   Sleep apnea 06/24/2022   Abdominal pulsatile mass 01/01/2016   Atrioventricular block, complete (HCC) 07/12/2012     Health Maintenance Due  Topic Date Due   DTaP/Tdap/Td (1 - Tdap) Never done   Zoster Vaccines- Shingrix (1 of 2) Never done   Pneumonia Vaccine 26+ Years old (1 of 1 - PCV) Never done   INFLUENZA VACCINE  04/23/2023   COVID-19 Vaccine (1 - 2024-25 season) Never done     Review of Systems 12 point ros is negative except what is mentioned above Physical Exam   Pulse (!) 35   Temp (!) 97.4 F (36.3 C) (Oral)   Physical Exam  Constitutional: He is oriented to person, place, and time. He appears well-developed and well-nourished. No distress.  HENT:  Mouth/Throat: Oropharynx is clear and moist. No oropharyngeal exudate.  Cardiovascular: Normal rate, regular rhythm and normal heart sounds. Exam reveals no gallop and no friction rub.  No murmur heard.  Pulmonary/Chest: Effort normal and breath sounds normal. No respiratory distress. He has no wheezes.  Abdominal: Soft. Bowel sounds are normal. He exhibits no distension. There is no tenderness.  Lymphadenopathy:  He has no cervical adenopathy.  Neurological: He is alert and oriented to person, place, and time.  Skin: Skin is warm and dry. No rash noted. No erythema.  Psychiatric: He has  a normal mood and affect. His behavior is normal.   Lab Results  Component Value Date   HEPBSAB NEG 05/29/2010   No results found for: "RPR", "LABRPR"  CBC Lab Results  Component Value Date   WBC 5.9 10/12/2023   RBC 5.02 10/12/2023   HGB 16.3 10/12/2023   HCT 48.0 10/12/2023   PLT 202 10/12/2023   MCV 96 10/12/2023   MCH 32.5 10/12/2023   MCHC 34.0 10/12/2023   RDW 12.6 10/12/2023   LYMPHSABS 2.1 06/11/2011   MONOABS 0.5 06/11/2011   EOSABS 0.1 06/11/2011    BMET Lab Results  Component Value Date   NA 142 10/12/2023   K 4.7 10/12/2023    CL 104 10/12/2023   CO2 24 10/12/2023   GLUCOSE 93 10/12/2023   BUN 12 10/12/2023   CREATININE 1.09 10/12/2023   CALCIUM 9.5 10/12/2023   GFRNONAA 68 06/06/2019   GFRAA 79 06/06/2019    Assessment and Plan Based on history and mri results, suspect that he had swelling and inflammation from tendonitis rather than true cellulitis. Has improved without antibiotics. MRI imaging is reassuring. He Does not need further work up.  Will let dr Graciela Husbands to go ahead and plan PPM Recommend mssa/mrsa screening prior ppm. To see if need decolonization with 5 day course of mupirocin bid and daily chg bathing.   I have personally spent 35 minutes involved in face-to-face and non-face-to-face activities for this patient on the day of the visit. Professional time spent includes the following activities: Preparing to see the patient (review of tests), Obtaining and/or reviewing separately obtained history (admission/discharge record), Performing a medically appropriate examination and/or evaluation , Ordering medications/tests/procedures, referring and communicating with other health care professionals, Documenting clinical information in the EMR, Independently interpreting results (not separately reported), Communicating results to the patient/family/caregiver, Counseling and educating the patient/family/caregiver and Care coordination (not separately reported).

## 2023-11-24 ENCOUNTER — Ambulatory Visit: Payer: 59 | Admitting: Internal Medicine

## 2023-12-09 ENCOUNTER — Encounter: Payer: 59 | Admitting: Internal Medicine

## 2023-12-09 ENCOUNTER — Encounter: Payer: Self-pay | Admitting: Internal Medicine

## 2023-12-09 DIAGNOSIS — Z01812 Encounter for preprocedural laboratory examination: Secondary | ICD-10-CM

## 2023-12-09 DIAGNOSIS — I442 Atrioventricular block, complete: Secondary | ICD-10-CM

## 2023-12-16 ENCOUNTER — Encounter (HOSPITAL_COMMUNITY)
Admission: RE | Admit: 2023-12-16 | Discharge: 2023-12-16 | Disposition: A | Discharge status: Home / Self Care | Source: Ambulatory Visit | Attending: Family Medicine | Admitting: Family Medicine

## 2023-12-16 DIAGNOSIS — Z01812 Encounter for preprocedural laboratory examination: Secondary | ICD-10-CM | POA: Diagnosis present

## 2023-12-16 DIAGNOSIS — Z01818 Encounter for other preprocedural examination: Secondary | ICD-10-CM

## 2023-12-16 LAB — SURGICAL PCR SCREEN
MRSA, PCR: NEGATIVE
Staphylococcus aureus: NEGATIVE

## 2023-12-16 NOTE — Telephone Encounter (Signed)
 Spoke with pt and reviewed Device instruction letter.  See letter for complete details.  Pt verbalizes understanding and agrees with current plan.

## 2023-12-17 ENCOUNTER — Other Ambulatory Visit: Payer: Self-pay

## 2023-12-17 DIAGNOSIS — I442 Atrioventricular block, complete: Secondary | ICD-10-CM

## 2023-12-17 DIAGNOSIS — Z01812 Encounter for preprocedural laboratory examination: Secondary | ICD-10-CM

## 2023-12-18 LAB — CBC
Hematocrit: 46.7 % (ref 37.5–51.0)
Hemoglobin: 15.9 g/dL (ref 13.0–17.7)
MCH: 31.6 pg (ref 26.6–33.0)
MCHC: 34 g/dL (ref 31.5–35.7)
MCV: 93 fL (ref 79–97)
Platelets: 188 10*3/uL (ref 150–450)
RBC: 5.03 x10E6/uL (ref 4.14–5.80)
RDW: 13.5 % (ref 11.6–15.4)
WBC: 6.9 10*3/uL (ref 3.4–10.8)

## 2023-12-18 LAB — BASIC METABOLIC PANEL WITH GFR
BUN/Creatinine Ratio: 14 (ref 10–24)
BUN: 12 mg/dL (ref 8–27)
CO2: 23 mmol/L (ref 20–29)
Calcium: 9.6 mg/dL (ref 8.6–10.2)
Chloride: 104 mmol/L (ref 96–106)
Creatinine, Ser: 0.87 mg/dL (ref 0.76–1.27)
Glucose: 74 mg/dL (ref 70–99)
Potassium: 4.4 mmol/L (ref 3.5–5.2)
Sodium: 143 mmol/L (ref 134–144)
eGFR: 91 mL/min/{1.73_m2} (ref >59)

## 2023-12-22 NOTE — Pre-Procedure Instructions (Signed)
 Attempted to call patient regarding procedure instructions. Unable to leave voicemail, mail box full.    Below are the instructions.   Arrival time 1000 Nothing to eat or drink after midnight No meds AM of procedure Responsible person to drive you home and stay with you for 24 hrs Wash with special soap night before and morning of procedure

## 2023-12-23 ENCOUNTER — Ambulatory Visit (HOSPITAL_COMMUNITY)

## 2023-12-23 ENCOUNTER — Ambulatory Visit (HOSPITAL_COMMUNITY)
Admission: RE | Admit: 2023-12-23 | Discharge: 2023-12-23 | Disposition: A | Discharge status: Home / Self Care | Attending: Internal Medicine | Admitting: Internal Medicine

## 2023-12-23 ENCOUNTER — Ambulatory Visit (HOSPITAL_COMMUNITY)
Admission: RE | Disposition: A | Discharge status: Home / Self Care | Payer: Self-pay | Source: Home / Self Care | Attending: Internal Medicine

## 2023-12-23 ENCOUNTER — Other Ambulatory Visit: Payer: Self-pay

## 2023-12-23 DIAGNOSIS — I119 Hypertensive heart disease without heart failure: Secondary | ICD-10-CM | POA: Diagnosis not present

## 2023-12-23 DIAGNOSIS — Z87891 Personal history of nicotine dependence: Secondary | ICD-10-CM | POA: Diagnosis not present

## 2023-12-23 DIAGNOSIS — G510 Bell's palsy: Secondary | ICD-10-CM | POA: Diagnosis not present

## 2023-12-23 DIAGNOSIS — M7989 Other specified soft tissue disorders: Secondary | ICD-10-CM | POA: Diagnosis not present

## 2023-12-23 DIAGNOSIS — I442 Atrioventricular block, complete: Secondary | ICD-10-CM | POA: Diagnosis present

## 2023-12-23 HISTORY — PX: PACEMAKER IMPLANT: EP1218

## 2023-12-23 SURGERY — PACEMAKER IMPLANT

## 2023-12-23 MED ORDER — SODIUM CHLORIDE 0.9% FLUSH: 3.0000 mL | INTRAVENOUS | Status: DC | PRN

## 2023-12-23 MED ORDER — LIDOCAINE HCL (PF) 1 % IJ SOLN
INTRAMUSCULAR | Status: AC
  Filled 2023-12-23: qty 90

## 2023-12-23 MED ORDER — SODIUM CHLORIDE 0.9% FLUSH: 3.0000 mL | Freq: Two times a day (BID) | INTRAVENOUS | Status: DC

## 2023-12-23 MED ORDER — POVIDONE-IODINE 10 % EX SWAB
2.0000 | Freq: Once | CUTANEOUS | Status: AC
  Administered 2023-12-23: 2 via TOPICAL

## 2023-12-23 MED ORDER — CEFAZOLIN SODIUM-DEXTROSE 2-4 GM/100ML-% IV SOLN
INTRAVENOUS | Status: AC
  Filled 2023-12-23: qty 100

## 2023-12-23 MED ORDER — MIDAZOLAM HCL 2 MG/2ML IJ SOLN
INTRAMUSCULAR | Status: AC
  Filled 2023-12-23: qty 2

## 2023-12-23 MED ORDER — LIDOCAINE HCL (PF) 1 % IJ SOLN
INTRAMUSCULAR | Status: DC | PRN
  Administered 2023-12-23: 45 mL

## 2023-12-23 MED ORDER — ONDANSETRON HCL 4 MG/2ML IJ SOLN: 4.0000 mg | Freq: Four times a day (QID) | INTRAMUSCULAR | Status: DC | PRN

## 2023-12-23 MED ORDER — CHLORHEXIDINE GLUCONATE 4 % EX SOLN: 4.0000 | Freq: Once | CUTANEOUS | Status: DC

## 2023-12-23 MED ORDER — HEPARIN (PORCINE) IN NACL 1000-0.9 UT/500ML-% IV SOLN
INTRAVENOUS | Status: DC | PRN
  Administered 2023-12-23: 500 mL

## 2023-12-23 MED ORDER — MIDAZOLAM HCL 5 MG/5ML IJ SOLN
INTRAMUSCULAR | Status: DC | PRN
  Administered 2023-12-23: 2 mg via INTRAVENOUS
  Administered 2023-12-23: 1 mg via INTRAVENOUS

## 2023-12-23 MED ORDER — CEFAZOLIN SODIUM-DEXTROSE 2-4 GM/100ML-% IV SOLN
2.0000 g | INTRAVENOUS | Status: AC
  Administered 2023-12-23: 2 g via INTRAVENOUS

## 2023-12-23 MED ORDER — SODIUM CHLORIDE 0.9 % IV SOLN
250.0000 mL | INTRAVENOUS | Status: DC
  Administered 2023-12-23: 250 mL via INTRAVENOUS

## 2023-12-23 MED ORDER — FENTANYL CITRATE (PF) 100 MCG/2ML IJ SOLN
INTRAMUSCULAR | Status: DC | PRN
  Administered 2023-12-23: 50 ug via INTRAVENOUS
  Administered 2023-12-23: 25 ug via INTRAVENOUS

## 2023-12-23 MED ORDER — ACETAMINOPHEN 325 MG PO TABS: 325.0000 mg | ORAL_TABLET | ORAL | Status: DC | PRN

## 2023-12-23 MED ORDER — SODIUM CHLORIDE 0.9 % IV SOLN
80.0000 mg | INTRAVENOUS | Status: AC
  Administered 2023-12-23: 80 mg

## 2023-12-23 MED ORDER — FENTANYL CITRATE (PF) 100 MCG/2ML IJ SOLN
INTRAMUSCULAR | Status: AC
  Filled 2023-12-23: qty 2

## 2023-12-23 MED ORDER — SODIUM CHLORIDE 0.9 % IV SOLN
INTRAVENOUS | Status: AC
  Filled 2023-12-23: qty 2

## 2023-12-23 MED ORDER — SODIUM CHLORIDE 0.9 % IV SOLN
INTRAVENOUS | Status: DC
Start: 2023-12-23 — End: 2023-12-24

## 2023-12-23 SURGICAL SUPPLY — 12 items
CABLE SURGICAL S-101-97-12 (CABLE) ×1 IMPLANT
CATH RIGHTSITE C315HIS02 (CATHETERS) IMPLANT
ELECT DEFIB PAD ADLT CADENCE (PAD) IMPLANT
IPG PACE AZUR XT DR MRI W1DR01 (Pacemaker) IMPLANT
LEAD CAPSURE NOVUS 5076-58CM (Lead) IMPLANT
LEAD SELECT SECURE 3830 383069 (Lead) IMPLANT
PACE AZURE XT DR MRI W1DR01 (Pacemaker) ×1 IMPLANT
SELECT SECURE 3830 383069 (Lead) ×1 IMPLANT
SHEATH 7FR PRELUDE SNAP 13 (SHEATH) IMPLANT
SLITTER 6232ADJ (MISCELLANEOUS) IMPLANT
TRAY PACEMAKER INSERTION (PACKS) ×1 IMPLANT
WIRE HI TORQ VERSACORE-J 145CM (WIRE) IMPLANT

## 2023-12-23 NOTE — Progress Notes (Signed)
 Patient in complete heart block. Notified MD via PA Ollis. MD called back and he is aware of this and his pacemaker settings will be changed tomorrow to allow for compensation. Will call MD with CXR results.

## 2023-12-23 NOTE — Discharge Instructions (Addendum)
 After Your Cardiac Device   You have a [employer] Cardiac Device  ACTIVITY Do not lift your arm above shoulder height for 1 week after your procedure. After 7 days, you may progress as below.  You should remove your sling 24 hours after your procedure, unless otherwise instructed by your provider.     Wednesday December 30, 2023  Thursday December 31, 2023 Friday January 01, 2024 Saturday January 02, 2024   Do not lift, push, pull, or carry anything over 10 pounds with the affected arm until 6 weeks (Wednesday Feb 03, 2024 ) after your procedure.   You may drive AFTER your wound check, unless you have been told otherwise by your provider.   Ask your healthcare provider when you can go back to work   INCISION/Dressing If you are on a blood thinner such as Coumadin, Xarelto, Eliquis, Plavix, or Pradaxa please confirm with your provider when this should be resumed.   If large square, outer bandage is left in place, this can be removed after 24 hours from your procedure. Do not remove steri-strips or glue as below.   If a PRESSURE DRESSING (a bulky dressing that usually goes up over your shoulder) was applied or left in place, please follow instructions given by your provider on when to return to have this removed.   Monitor your cardiac device site for redness, swelling, and drainage. Call the device clinic at [phone] if you experience these symptoms or fever/chills.  If your incision is closed with Dermabond/Surgical glue. You may shower 1 day after your cardiac device implant and wash around the site with soap and water.    If you were discharged in a sling, please do not wear this during the day more than 48 hours after your surgery unless otherwise instructed. This may increase the risk of stiffness and soreness in your shoulder.   Avoid lotions, ointments, or perfumes over your incision until it is well-healed.  You may use a hot tub or a pool AFTER your wound check appointment if  the incision is completely closed.   DEVICE MANAGEMENT You should receive your ID card for your new device in 4-8 weeks. Keep this card with you at all times once received. Consider wearing a medical alert bracelet or necklace.  Please follow manufacture instructions for keeping your device charged carefully. This should be performed every 2 weeks at the very least.   Your cardiac device may be MRI compatible. This will be discussed at your next office visit/wound check.  You should avoid contact with strong electric or magnetic fields.   Do not use amateur (ham) radio equipment or electric (arc) welding torches. MP3 player headphones with magnets should not be used. Some devices are safe to use if held at least 12 inches (30 cm) from your cardiac device. These include power tools, lawn mowers, and speakers. If you are unsure if something is safe to use, ask your health care provider.  When using your cell phone, hold it to the ear that is on the opposite side from the cardiac device. Do not leave your cell phone in a pocket over the cardiac device.  You may safely use electric blankets, heating pads, computers, and microwave ovens.  Call the office right away if: You have chest pain. You feel more short of breath than you have felt before. You feel more light-headed than you have felt before. Your incision starts to open up.  This information is not intended to replace  advice given to you by your health care provider. Make sure you discuss any questions you have with your health care provider.

## 2023-12-23 NOTE — H&P (Signed)
 Patient Care Team: Orpha Bur, MD as PCP - General (Family Medicine)   HPI  Calvin [name] is a 73 y.o. 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   Nocturnal bradycardia with PP lengthening suggestive of hyper vagotonia and sleep apnea.  Underwent a sleep study 12/20 (AHI 11)  which was abnormal and a repeat sleep study was to be done   Has had gout and interval swelling and erythema of his L leg which on two previous occasions prompted [place] to delay the procedure because of concerns re infection    The patient denies chest pain, shortness of breath, nocturnal dyspnea, orthopnea or peripheral edema.  There have been no palpitations, lightheadedness or syncope.       Date Cr K Hgb  09/20 1.10 4.0 15.3(9/12)  7/23 1.08 4.9 15.8  11/24 1.12 4.6 16.2    DATE TEST EF%    11/13 CMRI 74%   Neg LGE  8/17 Echo  55-60 %    7/20 Echo  55-60 %    10/24 Echo  55-60%         Records and Results Reviewed   Past Medical History:  Diagnosis Date   Atrioventricular block, complete (HCC)    Diverticulosis of colon    Left, colonic   High frequency hearing loss    Hx of adenomatous colonic polyps    Hyperlipidemia    LVH (left ventricular hypertrophy) 2011   concentric, on echocardiogram    Rheumatic fever    Skin cancer, basal cell     Past Surgical History:  Procedure Laterality Date   Arthroscopic knee surgery     Basal cell skin cancer surgery     COLONOSCOPY WITH PROPOFOL N/A 04/18/2019   Procedure: COLONOSCOPY WITH PROPOFOL;  Surgeon: Carman Ching, MD;  Location: [facility];  Service: Endoscopy;  Laterality: N/A;    Current Facility-Administered Medications  Medication Dose Route Frequency Provider Last Rate Last Admin    0.9 %  sodium chloride infusion   Intravenous Continuous Duke Salvia, MD 50 mL/hr at 12/23/23 1033 New Bag at 12/23/23 1033   0.9 %  sodium chloride infusion  250 mL Intravenous Continuous Duke Salvia, MD 1 mL/hr at 12/23/23 1033 250 mL at 12/23/23 1033   ceFAZolin (ANCEF) IVPB 2g/100 mL premix  2 g Intravenous On Call Duke Salvia, MD       chlorhexidine (HIBICLENS) 4 % liquid 4 Application  4 Application Topical Once Duke Salvia, MD       gentamicin (GARAMYCIN) 80 mg in sodium chloride 0.9 % 500 mL irrigation  80 mg Irrigation On Call Duke Salvia, MD       sodium chloride flush (NS) 0.9 % injection 3 mL  3 mL Intravenous Q12H Duke Salvia, MD       sodium chloride flush (NS) 0.9 % injection 3 mL  3 mL Intravenous PRN Duke Salvia, MD        Allergies  Allergen Reactions   Valtrex [Valacyclovir] Photosensitivity      Social History   Tobacco Use   Smoking status: Former   Smokeless tobacco: Never  Vaping Use   Vaping status: Never Used  Substance Use Topics  Alcohol use: Yes    Alcohol/week: 10.0 standard drinks of alcohol    Types: 10 Cans of beer per week    Comment: weekly   Drug use: No     Family History  Problem Relation Age of Onset   Bone cancer Mother        Polio   Hyperlipidemia Father      Current Meds  Medication Sig   acidophilus (RISAQUAD) CAPS capsule Take 1 capsule by mouth daily.   B Complex-C (B-COMPLEX WITH VITAMIN C) tablet Take 1 tablet by mouth daily.   Bacillus Coagulans-Inulin (PROBIOTIC) 1-250 BILLION-MG CAPS    Carboxymethylcellul-Glycerin (REFRESH TEARS PF OP) Place 1 drop into both eyes as needed (dry eyes).   Cholecalciferol (VITAMIN D-3) 125 MCG (5000 UT) TABS Take 5,000 Units by mouth daily.   ECHINACEA EXTRACT PO Take 760 mg by mouth daily.   ibuprofen (ADVIL) 200 MG tablet Take 200 mg by mouth every 8 (eight) hours as needed (pain/inflammation.).   levothyroxine (SYNTHROID, LEVOTHROID) 112 MCG tablet Take  112 mcg by mouth daily before breakfast.    Magnesium 250 MG TABS Take 250 mg by mouth in the morning.   TURMERIC PO Take 500 mg by mouth daily.   zinc gluconate 50 MG tablet Take 50 mg by mouth daily.     Review of Systems negative except from HPI and PMH  Physical Exam BP (!) 190/79   Pulse (!) 37   Temp 98.2 F (36.8 C) (Oral)   Resp 17   Ht 6\' 1"  (1.854 m)   Wt 87.1 kg   SpO2 97%   BMI 25.33 kg/m  Well developed and well nourished in no acute distress HENT normal E scleral and icterus clear Neck Supple Clear to ausculation Slow Regular rate and rhythm, no murmurs gallops or rub Soft with active bowel sounds No clubbing cyanosis none Edema Alert and oriented, grossly normal motor and sensory function Skin Warm and Dry    Assessment and  Plan Complete heart block-   New right bundle branch block   Bells Palsy     L leg swelling L forefoot lesion    Hypertension -  The benefits and risks were reviewed including but not limited to death,  perforation, infection, lead dislodgement and device malfunction.  The patient understands agrees and is willing to proceed. ]

## 2023-12-24 ENCOUNTER — Encounter (HOSPITAL_COMMUNITY): Payer: Self-pay | Admitting: Internal Medicine

## 2023-12-24 ENCOUNTER — Ambulatory Visit: Attending: Cardiology

## 2023-12-24 DIAGNOSIS — I442 Atrioventricular block, complete: Secondary | ICD-10-CM

## 2023-12-24 LAB — CUP PACEART INCLINIC DEVICE CHECK
Battery Voltage: 3.21 V
Brady Statistic AP VP Percent: 2.84 %
Brady Statistic AP VS Percent: 0.01 %
Brady Statistic AS VP Percent: 0.01 %
Brady Statistic AS VS Percent: 97.14 %
Brady Statistic RA Percent Paced: 3.34 %
Brady Statistic RV Percent Paced: 2.85 %
Date Time Interrogation Session: 20250403152735
Implantable Lead Connection Status: 753985
Implantable Lead Connection Status: 753985
Implantable Lead Implant Date: 20250402
Implantable Lead Implant Date: 20250402
Implantable Lead Location: 753859
Implantable Lead Location: 753860
Implantable Lead Model: 3830
Implantable Lead Model: 5076
Implantable Pulse Generator Implant Date: 20250402
Lead Channel Impedance Value: 361 Ohm
Lead Channel Impedance Value: 380 Ohm
Lead Channel Impedance Value: 456 Ohm
Lead Channel Impedance Value: 475 Ohm
Lead Channel Pacing Threshold Amplitude: 0.625 V
Lead Channel Pacing Threshold Pulse Width: 0.4 ms
Lead Channel Sensing Intrinsic Amplitude: 1 mV
Lead Channel Sensing Intrinsic Amplitude: 4 mV
Lead Channel Sensing Intrinsic Amplitude: 4.75 mV
Lead Channel Sensing Intrinsic Amplitude: 6.25 mV
Lead Channel Setting Pacing Amplitude: 3.5 V
Lead Channel Setting Pacing Amplitude: 3.5 V
Lead Channel Setting Pacing Pulse Width: 0.4 ms
Lead Channel Setting Sensing Sensitivity: 1.2 mV
Zone Setting Status: 755011
Zone Setting Status: 755011

## 2023-12-24 MED FILL — Midazolam HCl Inj 2 MG/2ML (Base Equivalent): INTRAMUSCULAR | Qty: 3 | Status: AC

## 2023-12-24 NOTE — Progress Notes (Signed)
 Pt seen in device clinic for reprogramming.  Device reprogrammed to DDD 50 per Dr. Graciela Husbands. Device function WNL.

## 2023-12-24 NOTE — Patient Instructions (Signed)
 Follow up as scheduled.

## 2024-01-06 ENCOUNTER — Ambulatory Visit: Attending: Cardiovascular Disease

## 2024-01-06 DIAGNOSIS — I442 Atrioventricular block, complete: Secondary | ICD-10-CM

## 2024-01-06 LAB — CUP PACEART INCLINIC DEVICE CHECK
Battery Remaining Longevity: 147 mo
Battery Voltage: 3.22 V
Brady Statistic AP VP Percent: 5.35 %
Brady Statistic AP VS Percent: 0.01 %
Brady Statistic AS VP Percent: 94.47 %
Brady Statistic AS VS Percent: 0.18 %
Brady Statistic RA Percent Paced: 5.33 %
Brady Statistic RV Percent Paced: 99.81 %
Date Time Interrogation Session: 20250416100041
Implantable Lead Connection Status: 753985
Implantable Lead Connection Status: 753985
Implantable Lead Implant Date: 20250402
Implantable Lead Implant Date: 20250402
Implantable Lead Location: 753859
Implantable Lead Location: 753860
Implantable Lead Model: 3830
Implantable Lead Model: 5076
Implantable Pulse Generator Implant Date: 20250402
Lead Channel Impedance Value: 342 Ohm
Lead Channel Impedance Value: 361 Ohm
Lead Channel Impedance Value: 456 Ohm
Lead Channel Impedance Value: 513 Ohm
Lead Channel Pacing Threshold Amplitude: 0.375 V
Lead Channel Pacing Threshold Amplitude: 0.75 V
Lead Channel Pacing Threshold Pulse Width: 0.4 ms
Lead Channel Pacing Threshold Pulse Width: 0.4 ms
Lead Channel Sensing Intrinsic Amplitude: 4.375 mV
Lead Channel Sensing Intrinsic Amplitude: 5.5 mV
Lead Channel Sensing Intrinsic Amplitude: 5.875 mV
Lead Channel Sensing Intrinsic Amplitude: 5.875 mV
Lead Channel Setting Pacing Amplitude: 3.5 V
Lead Channel Setting Pacing Amplitude: 3.5 V
Lead Channel Setting Pacing Pulse Width: 0.4 ms
Lead Channel Setting Sensing Sensitivity: 1.2 mV
Zone Setting Status: 755011
Zone Setting Status: 755011

## 2024-01-06 NOTE — Progress Notes (Signed)
 Normal dual chamber pacemaker wound check. Presenting rhythm: AS/ VP- 71 . Wound well healed. Routine testing performed. Thresholds, sensing, and impedances consistent with implant measurements and at 3.5V safety margin/auto capture until 3 month visit. No episodes. Reviewed arm restrictions to continue for 6 weeks total post op.  Pt enrolled in remote follow-up.

## 2024-01-06 NOTE — Patient Instructions (Signed)
   After Your Pacemaker   Monitor your pacemaker site for redness, swelling, and drainage. Call the device clinic at [phone] if you experience these symptoms or fever/chills.  Your incision was closed with Dermabond:  You may shower 1 day after your defibrillator implant and wash your incision with soap and water. Avoid lotions, ointments, or perfumes over your incision until it is well-healed.  You may use a hot tub or a pool after your wound check appointment if the incision is completely closed.  Do not lift, push or pull greater than 10 pounds with the affected arm until 6 weeks after your procedure. There are no other restrictions in arm movement after your wound check appointment.  You may drive, unless driving has been restricted by your healthcare providers.  Remote monitoring is used to monitor your pacemaker from home. This monitoring is scheduled every 91 days by our office. It allows [place] to keep an eye on the functioning of your device to ensure it is working properly. You will routinely see your Electrophysiologist annually (more often if necessary).

## 2024-01-21 ENCOUNTER — Encounter: Payer: Self-pay | Admitting: Internal Medicine

## 2024-01-25 ENCOUNTER — Telehealth: Payer: Self-pay | Admitting: *Deleted

## 2024-01-25 NOTE — Telephone Encounter (Signed)
 Called and spoke to patient and patient wanted to know who to reach out to for questions about device. Advise patient to reach out to device clinic. Per patient Dr. Rodolfo Clan is retiring and verbalized last provider seen was [place], [place]. Advise patient to inquire about obtaining another cardiology. Made patient aware to call office for any other questions. Understanding verbalized.

## 2024-01-29 ENCOUNTER — Telehealth: Payer: Self-pay

## 2024-01-29 ENCOUNTER — Encounter: Payer: Self-pay | Admitting: *Deleted

## 2024-01-29 NOTE — Telephone Encounter (Signed)
 Returned patient call regarding his incision site Patient reports his site was itchy 2 days ago and that he scratched at it Patient was uncertain of how site appeared in the days prior but believes this is new Patient denies any pain at the site Patient denies any drainage or foul odor from the site Patient denies fever/chills and that his temp today was 96.9 F which is around his normal per self report Device implanted 12/23/23  Will route to Creighton Doffing, NP for review and advisement

## 2024-01-29 NOTE — Telephone Encounter (Signed)
 Pt called stating that his incision is irritated and red and will send a pic through email and would like a call back when received.

## 2024-01-29 NOTE — Telephone Encounter (Signed)
 Patient scheduled to see Calvin [name] on Monday May 12th at 0940.

## 2024-02-01 ENCOUNTER — Ambulatory Visit: Attending: Student | Admitting: Student

## 2024-02-01 ENCOUNTER — Encounter: Payer: 59 | Admitting: Student

## 2024-02-01 DIAGNOSIS — I442 Atrioventricular block, complete: Secondary | ICD-10-CM

## 2024-02-01 DIAGNOSIS — T829XXD Unspecified complication of cardiac and vascular prosthetic device, implant and graft, subsequent encounter: Secondary | ICD-10-CM | POA: Diagnosis not present

## 2024-02-01 DIAGNOSIS — T829XXA Unspecified complication of cardiac and vascular prosthetic device, implant and graft, initial encounter: Secondary | ICD-10-CM

## 2024-02-01 NOTE — Progress Notes (Signed)
 Pt with small amount of retained suture which was easily removed with suture removal kit.   No s/s infection, no drainage, or bleeding.   Continue 6 week post op instructions as usual.   OK to continue to bathe with warm soap and water and pat dry. No lotions, ointments, creams, or powders until completely healed.   Bambi Lever [address]" [place], PA-C  02/01/2024 9:54 AM

## 2024-02-01 NOTE — Patient Instructions (Signed)
 Medication Instructions:  Your physician recommends that you continue on your current medications as directed. Please refer to the Current Medication list given to you today.  *If you need a refill on your cardiac medications before your next appointment, please call your pharmacy*  Lab Work: None ordered If you have labs (blood work) drawn today and your tests are completely normal, you will receive your results only by: MyChart Message (if you have MyChart) OR A paper copy in the mail If you have any lab test that is abnormal or we need to change your treatment, we will call you to review the results.  Follow-Up: At [facility], you and your health needs are our priority.  As part of our continuing mission to provide you with exceptional heart care, our providers are all part of one team.  This team includes your primary Cardiologist (physician) and Advanced Practice Providers or APPs (Physician Assistants and Nurse Practitioners) who all work together to provide you with the care you need, when you need it.  Your next appointment:   As previously scheduled

## 2024-02-08 ENCOUNTER — Ambulatory Visit

## 2024-02-08 DIAGNOSIS — I442 Atrioventricular block, complete: Secondary | ICD-10-CM | POA: Diagnosis not present

## 2024-02-09 LAB — CUP PACEART REMOTE DEVICE CHECK
Battery Remaining Longevity: 140 mo
Battery Voltage: 3.21 V
Brady Statistic AP VP Percent: 0.69 %
Brady Statistic AP VS Percent: 0 %
Brady Statistic AS VP Percent: 99.25 %
Brady Statistic AS VS Percent: 0.06 %
Brady Statistic RA Percent Paced: 0.69 %
Brady Statistic RV Percent Paced: 99.94 %
Date Time Interrogation Session: 20250518212722
Implantable Lead Connection Status: 753985
Implantable Lead Connection Status: 753985
Implantable Lead Implant Date: 20250402
Implantable Lead Implant Date: 20250402
Implantable Lead Location: 753859
Implantable Lead Location: 753860
Implantable Lead Model: 3830
Implantable Lead Model: 5076
Implantable Pulse Generator Implant Date: 20250402
Lead Channel Impedance Value: 304 Ohm
Lead Channel Impedance Value: 323 Ohm
Lead Channel Impedance Value: 456 Ohm
Lead Channel Impedance Value: 456 Ohm
Lead Channel Pacing Threshold Amplitude: 0.5 V
Lead Channel Pacing Threshold Amplitude: 0.75 V
Lead Channel Pacing Threshold Pulse Width: 0.4 ms
Lead Channel Pacing Threshold Pulse Width: 0.4 ms
Lead Channel Sensing Intrinsic Amplitude: 3.375 mV
Lead Channel Sensing Intrinsic Amplitude: 3.375 mV
Lead Channel Sensing Intrinsic Amplitude: 5.375 mV
Lead Channel Sensing Intrinsic Amplitude: 5.375 mV
Lead Channel Setting Pacing Amplitude: 3.5 V
Lead Channel Setting Pacing Amplitude: 3.5 V
Lead Channel Setting Pacing Pulse Width: 0.4 ms
Lead Channel Setting Sensing Sensitivity: 1.2 mV
Zone Setting Status: 755011
Zone Setting Status: 755011

## 2024-02-18 ENCOUNTER — Ambulatory Visit: Payer: Self-pay | Admitting: Cardiovascular Disease

## 2024-03-22 NOTE — Progress Notes (Unsigned)
  Electrophysiology Office Note:   Date:  03/23/2024  ID:  Calvin [name], DOB 13-Nov-1950, MRN [id number]  Primary Cardiologist: None Primary Heart Failure: None Electrophysiologist: Eulas FORBES Furbish, MD       History of Present Illness:   Calvin [name] is a 73 y.o. male with h/o CHB s/p PPM, HLD, OSA, Bell's Palsey, seen today for routine electrophysiology follow-up s/p Pacemaker implant.  Since last being seen in our clinic the patient reports he has healed well post device implant. No device related concerns but he thinks his home monitor stopped working.    He denies chest pain, palpitations, dyspnea, PND, orthopnea, nausea, vomiting, dizziness, syncope, edema, weight gain, or early satiety.    Review of systems complete and found to be negative unless listed in HPI.    EP Information / Studies Reviewed:    EKG is ordered today. Personal review as below.  EKG Interpretation Date/Time:  Wednesday March 23 2024 14:17:55 EDT Ventricular Rate:  56 PR Interval:  206 QRS Duration:  136 QT Interval:  498 QTC Calculation: 480 R Axis:   5  Text Interpretation: Atrial-sensed ventricular-paced rhythm Confirmed by Aniceto Jarvis ([id number]) on 03/23/2024 2:22:12 PM   PPM Interrogation-  reviewed in detail today,  See PACEART report.  Device History: Medtronic Dual Chamber PPM implanted 12/23/23 for Symptomatic bradycardia  Studies:  ECHO 06/2023 > LVEF 55-60%     Risk Assessment/Calculations:              Physical Exam:   VS:  BP 134/80   Pulse (!) 56   Ht 6' 1 (1.854 m)   Wt 198 lb 6.4 oz (90 kg)   SpO2 98%   BMI 26.18 kg/m    Wt Readings from Last 3 Encounters:  03/23/24 198 lb 6.4 oz (90 kg)  12/23/23 192 lb (87.1 kg)  10/30/23 188 lb (85.3 kg)     GEN: pleasant, well nourished, well developed in no acute distress NECK: No JVD; No carotid bruits CARDIAC: Regular rate and rhythm, no murmurs, rubs, gallops RESPIRATORY:  Clear to auscultation without rales,  wheezing or rhonchi  ABDOMEN: Soft, non-tender, non-distended EXTREMITIES:  No edema; No deformity   ASSESSMENT AND PLAN:    Symptomatic bradycardia s/p Medtronic PPM RBBB  -Normal PPM function -See Pace Art report -No changes today -site well healed post implant 12/23/23  -new home monitor given to patient by [employer]  -no R waves at 40 bpm on testing 03/23/24  Hypertension  -not on anti-hypertensive agents > discussed keeping a record with him for his PCP to review at his upcoming visit. Recheck in clinic wnl / though initially elevated.   Disposition:   Follow up with Dr. Furbish in 12 months  Signed, Jarvis Aniceto, NP-C, AGACNP-BC  [facility] - Electrophysiology  03/23/2024, 4:39 PM

## 2024-03-23 ENCOUNTER — Ambulatory Visit: Attending: Cardiology | Admitting: Pulmonary Disease

## 2024-03-23 ENCOUNTER — Encounter: Payer: Self-pay | Admitting: Pulmonary Disease

## 2024-03-23 VITALS — BP 134/80 | HR 56 | Ht 73.0 in | Wt 198.4 lb

## 2024-03-23 DIAGNOSIS — Z950 Presence of cardiac pacemaker: Secondary | ICD-10-CM

## 2024-03-23 DIAGNOSIS — I442 Atrioventricular block, complete: Secondary | ICD-10-CM

## 2024-03-23 LAB — CUP PACEART INCLINIC DEVICE CHECK
Date Time Interrogation Session: 20250702163539
Implantable Lead Connection Status: 753985
Implantable Lead Connection Status: 753985
Implantable Lead Implant Date: 20250402
Implantable Lead Implant Date: 20250402
Implantable Lead Location: 753859
Implantable Lead Location: 753860
Implantable Lead Model: 3830
Implantable Lead Model: 5076
Implantable Pulse Generator Implant Date: 20250402

## 2024-03-23 NOTE — Patient Instructions (Signed)
 Medication Instructions:  Your physician recommends that you continue on your current medications as directed. Please refer to the Current Medication list given to you today.  *If you need a refill on your cardiac medications before your next appointment, please call your pharmacy*  Lab Work: None ordered If you have labs (blood work) drawn today and your tests are completely normal, you will receive your results only by: MyChart Message (if you have MyChart) OR A paper copy in the mail If you have any lab test that is abnormal or we need to change your treatment, we will call you to review the results.  Follow-Up: At [facility], you and your health needs are our priority.  As part of our continuing mission to provide you with exceptional heart care, our providers are all part of one team.  This team includes your primary Cardiologist (physician) and Advanced Practice Providers or APPs (Physician Assistants and Nurse Practitioners) who all work together to provide you with the care you need, when you need it.  Your next appointment:   1 year(s)  Provider:   You may see Eulas FORBES Furbish, MD or one of the following Advanced Practice Providers on your designated Care Team:   Charlies Arthur, PA-C Michael Andy Tillery, PA-C Suzann Riddle, NP Daphne Barrack, NP  Keep a blood pressure log at home and take it with you to your appointment with your primary care provider in August.

## 2024-03-24 NOTE — Progress Notes (Signed)
 Remote pacemaker transmission.

## 2024-03-24 NOTE — Addendum Note (Signed)
 Addended by: TAWNI DRILLING D on: 03/24/2024 04:45 PM   Modules accepted: Orders

## 2024-03-28 ENCOUNTER — Ambulatory Visit: Payer: Self-pay | Admitting: Cardiovascular Disease

## 2024-05-09 ENCOUNTER — Ambulatory Visit (INDEPENDENT_AMBULATORY_CARE_PROVIDER_SITE_OTHER)

## 2024-05-09 DIAGNOSIS — I442 Atrioventricular block, complete: Secondary | ICD-10-CM | POA: Diagnosis not present

## 2024-05-11 LAB — CUP PACEART REMOTE DEVICE CHECK
Battery Remaining Longevity: 151 mo
Battery Voltage: 3.17 V
Brady Statistic AP VP Percent: 17.54 %
Brady Statistic AP VS Percent: 0 %
Brady Statistic AS VP Percent: 81.71 %
Brady Statistic AS VS Percent: 0.75 %
Brady Statistic RA Percent Paced: 17.52 %
Brady Statistic RV Percent Paced: 99.25 %
Date Time Interrogation Session: 20250818002337
Implantable Lead Connection Status: 753985
Implantable Lead Connection Status: 753985
Implantable Lead Implant Date: 20250402
Implantable Lead Implant Date: 20250402
Implantable Lead Location: 753859
Implantable Lead Location: 753860
Implantable Lead Model: 3830
Implantable Lead Model: 5076
Implantable Pulse Generator Implant Date: 20250402
Lead Channel Impedance Value: 304 Ohm
Lead Channel Impedance Value: 323 Ohm
Lead Channel Impedance Value: 418 Ohm
Lead Channel Impedance Value: 437 Ohm
Lead Channel Pacing Threshold Amplitude: 0.5 V
Lead Channel Pacing Threshold Amplitude: 0.875 V
Lead Channel Pacing Threshold Pulse Width: 0.4 ms
Lead Channel Pacing Threshold Pulse Width: 0.4 ms
Lead Channel Sensing Intrinsic Amplitude: 2.25 mV
Lead Channel Sensing Intrinsic Amplitude: 2.25 mV
Lead Channel Sensing Intrinsic Amplitude: 6.25 mV
Lead Channel Sensing Intrinsic Amplitude: 6.25 mV
Lead Channel Setting Pacing Amplitude: 1.5 V
Lead Channel Setting Pacing Amplitude: 2 V
Lead Channel Setting Pacing Pulse Width: 0.4 ms
Lead Channel Setting Sensing Sensitivity: 1.2 mV
Zone Setting Status: 755011
Zone Setting Status: 755011

## 2024-05-16 ENCOUNTER — Ambulatory Visit: Payer: Self-pay | Admitting: Cardiovascular Disease

## 2024-06-01 ENCOUNTER — Telehealth: Payer: Self-pay | Admitting: *Deleted

## 2024-06-01 ENCOUNTER — Encounter: Payer: Self-pay | Admitting: Cardiovascular Disease

## 2024-06-01 ENCOUNTER — Telehealth: Payer: Self-pay

## 2024-06-01 NOTE — Progress Notes (Signed)
 PERIOPERATIVE PRESCRIPTION FOR IMPLANTED CARDIAC DEVICE PROGRAMMING   Patient Information:  Patient: Calvin [name]  MRN: [id number]  Date of Birth: Dec 01, 1950   Procedure:  COLONOSCOPY   Date of Surgery:  Clearance 07/11/24                                Surgeon:  DR. ELICIA Surgeon's Group or Practice Name:  [facility] Phone number:  [phone] Fax number:  [phone]   Device Information:   Clinic EP Physician: Eulas Furbish, MD Device Type:  Pacemaker Manufacturer and Phone #:  Medtronic: [phone] Pacemaker Dependent?:  Yes Date of Last Device Check:  05/09/2024         Normal Device Function?:  Yes     Electrophysiologist's Recommendations:   Have magnet available. Provide continuous ECG monitoring when magnet is used or reprogramming is to be performed.  Procedure may interfere with device function.  Magnet should be placed over device during procedure.  Per Device Clinic Standing Orders, Rozelle JONELLE Banter  06/01/2024 4:03 PM

## 2024-06-01 NOTE — Telephone Encounter (Signed)
   Pre-operative Risk Assessment    Patient Name: Calvin [name]  DOB: [birth year] MRN: [id number]   Date of last office visit: 03/23/24 DAPHNE BARRACK, NP Date of next office visit: NONE   Request for Surgical Clearance    Procedure:  COLONOSCOPY  Date of Surgery:  Clearance 07/11/24                                Surgeon:  DR. ELICIA Surgeon's Group or Practice Name:  [facility] Phone number:  [phone] Fax number:  [phone]   Type of Clearance Requested:   - Medical ; NONE INDICTED TO BE HELD; WILL NEED DEVICE CLEARANCE; WILL SEND TO DEVICE CLINIC AS WELL   Type of Anesthesia:  PROPOFOL     Additional requests/questions:    [name]   06/01/2024, 3:29 PM

## 2024-06-01 NOTE — Telephone Encounter (Signed)
 S/W pt and scheduled TELE Preop appt 06/13/24 Med Rec and Consent done   Will update surgeons office.

## 2024-06-01 NOTE — Telephone Encounter (Signed)
   Name: Calvin [name]  DOB: Sep 02, 1951  MRN: [id number]  Primary Cardiologist: None   Preoperative team, please contact this patient and set up a phone call appointment for further preoperative risk assessment. Please obtain consent and complete medication review. Thank you for your help.  I confirm that guidance regarding antiplatelet and oral anticoagulation therapy has been completed and, if necessary, noted below.  Patient is not on anticoagulation or antiplatelet per review of medical record in Epic.    I also confirmed the patient resides in the state of [place] . As per [facility] Medical Board telemedicine laws, the patient must reside in the state in which the provider is licensed.   Lamarr Satterfield, NP 06/01/2024, 3:36 PM  [facility]

## 2024-06-01 NOTE — Telephone Encounter (Signed)
 Med Rec and Consent done     Patient Consent for Virtual Visit        Calvin [name] has provided verbal consent on 06/01/2024 for a virtual visit (video or telephone).   CONSENT FOR VIRTUAL VISIT FOR:  Calvin [name]  By participating in this virtual visit I agree to the following:  I hereby voluntarily request, consent and authorize [facility] and its employed or contracted physicians, physician assistants, nurse practitioners or other licensed health care professionals (the Practitioner), to provide me with telemedicine health care services (the "Services) as deemed necessary by the treating Practitioner. I acknowledge and consent to receive the Services by the Practitioner via telemedicine. I understand that the telemedicine visit will involve communicating with the Practitioner through live audiovisual communication technology and the disclosure of certain medical information by electronic transmission. I acknowledge that I have been given the opportunity to request an in-person assessment or other available alternative prior to the telemedicine visit and am voluntarily participating in the telemedicine visit.  I understand that I have the right to withhold or withdraw my consent to the use of telemedicine in the course of my care at any time, without affecting my right to future care or treatment, and that the Practitioner or I may terminate the telemedicine visit at any time. I understand that I have the right to inspect all information obtained and/or recorded in the course of the telemedicine visit and may receive copies of available information for a reasonable fee.  I understand that some of the potential risks of receiving the Services via telemedicine include:  Delay or interruption in medical evaluation due to technological equipment failure or disruption; Information transmitted may not be sufficient (e.g. poor resolution of images) to allow for appropriate medical  decision making by the Practitioner; and/or  In rare instances, security protocols could fail, causing a breach of personal health information.  Furthermore, I acknowledge that it is my responsibility to provide information about my medical history, conditions and care that is complete and accurate to the best of my ability. I acknowledge that Practitioner's advice, recommendations, and/or decision may be based on factors not within their control, such as incomplete or inaccurate data provided by me or distortions of diagnostic images or specimens that may result from electronic transmissions. I understand that the practice of medicine is not an exact science and that Practitioner makes no warranties or guarantees regarding treatment outcomes. I acknowledge that a copy of this consent can be made available to me via my patient portal [facility] MyChart), or I can request a printed copy by calling the office of [facility].    I understand that my insurance will be billed for this visit.   I have read or had this consent read to me. I understand the contents of this consent, which adequately explains the benefits and risks of the Services being provided via telemedicine.  I have been provided ample opportunity to ask questions regarding this consent and the Services and have had my questions answered to my satisfaction. I give my informed consent for the services to be provided through the use of telemedicine in my medical care

## 2024-06-13 ENCOUNTER — Ambulatory Visit: Attending: Cardiology | Admitting: Nurse Practitioner

## 2024-06-13 DIAGNOSIS — Z01810 Encounter for preprocedural cardiovascular examination: Secondary | ICD-10-CM

## 2024-06-13 NOTE — Progress Notes (Signed)
 Virtual Visit via Telephone Note   Because of Calvin [name] co-morbid illnesses, he is at least at moderate risk for complications without adequate follow up.  This format is felt to be most appropriate for this patient at this time.  Due to technical limitations with video connection (technology), today's appointment will be conducted as an audio only telehealth visit, and Calvin [name] verbally agreed to proceed in this manner.   All issues noted in this document were discussed and addressed.  No physical exam could be performed with this format.  Evaluation Performed:  Preoperative cardiovascular risk assessment _____________   Date:  06/13/2024   Patient ID:  Calvin [name], DOB [birth year], MRN [id number] Patient Location:  Home Provider location:   Office  Primary Care Provider:  Delayne Artist PARAS, MD Primary Cardiologist:  None  Chief Complaint / Patient Profile   73 y.o. y/o male with a h/o complete heart blocks s/p PPM, hyperlipidemia, OSA, and Bell's palsy who is pending colonoscopy on 07/11/2004 with Dr. Elicia of [facility] and presents today for telephonic preoperative cardiovascular risk assessment.  History of Present Illness    Calvin [name] is a 73 y.o. male who presents via audio/video conferencing for a telehealth visit today.  Pt was last seen in cardiology clinic on 03/23/2024 by Daphne Barrack, NP.  At that time Calvin [name] was doing well.  The patient is now pending procedure as outlined above. Since his last visit, he has done well from a cardiac standpoint.  He did have 1 brief episode of dizziness several weeks ago, he denies any further dizziness.  He denies chest pain, palpitations, dyspnea, pnd, orthopnea, n, v, syncope, edema, weight gain, or early satiety. All other systems reviewed and are otherwise negative except as noted above.   Past Medical History    Past Medical History:  Diagnosis Date   Atrioventricular block, complete (HCC)     Diverticulosis of colon    Left, colonic   High frequency hearing loss    Hx of adenomatous colonic polyps    Hyperlipidemia    LVH (left ventricular hypertrophy) 2011   concentric, on echocardiogram    Rheumatic fever    Skin cancer, basal cell    Past Surgical History:  Procedure Laterality Date   Arthroscopic knee surgery     Basal cell skin cancer surgery     COLONOSCOPY WITH PROPOFOL  N/A 04/18/2019   Procedure: COLONOSCOPY WITH PROPOFOL ;  Surgeon: Celestia Agent, MD;  Location: [facility];  Service: Endoscopy;  Laterality: N/A;   PACEMAKER IMPLANT N/A 12/23/2023   Procedure: PACEMAKER IMPLANT;  Surgeon: Fernande Elspeth BROCKS, MD;  Location: [facility] INVASIVE CV LAB;  Service: Cardiovascular;  Laterality: N/A;    Allergies  Allergies  Allergen Reactions   Valtrex [Valacyclovir] Photosensitivity    Home Medications    Prior to Admission medications   Medication Sig Start Date End Date Taking? Authorizing Provider  acidophilus (RISAQUAD) CAPS capsule Take 1 capsule by mouth daily.    [provider]  B Complex-C (B-COMPLEX WITH VITAMIN C) tablet Take 1 tablet by mouth daily.    [provider]  Bacillus Coagulans-Inulin (PROBIOTIC) 1-250 BILLION-MG CAPS  10/24/23   [provider]  Carboxymethylcellul-Glycerin (REFRESH TEARS PF OP) Place 1 drop into both eyes as needed (dry eyes).    [provider]  Cholecalciferol (VITAMIN D-3) 125 MCG (5000 UT) TABS Take 5,000 Units by mouth daily.    [provider]  [facility]  EXTRACT PO Take 760 mg by mouth daily.    [provider]  ibuprofen (ADVIL) 200 MG tablet Take 200 mg by mouth every 8 (eight) hours as needed (pain/inflammation.).    [provider]  levothyroxine (SYNTHROID, LEVOTHROID) 112 MCG tablet Take 112 mcg by mouth daily before breakfast.  06/25/16   [provider]  Magnesium 250 MG TABS Take 250 mg by mouth in the morning.    [provider]  TURMERIC  PO Take 500 mg by mouth daily.    [provider]  vitamin C (ASCORBIC ACID) 500 MG tablet Take 500 mg by mouth daily.    [provider]  zinc gluconate 50 MG tablet Take 50 mg by mouth daily.    [provider]    Physical Exam    Vital Signs:  Calvin [name] does not have vital signs available for review today.  Given telephonic nature of communication, physical exam is limited. AAOx3. NAD. Normal affect.  Speech and respirations are unlabored.  Accessory Clinical Findings    None  Assessment & Plan    1.  Preoperative Cardiovascular Risk Assessment:  According to the Revised Cardiac Risk Index (RCRI), his Perioperative Risk of Major Cardiac Event is (%): 0.4. His Functional Capacity in METs is: 7.99 according to the [employer] Activity Status Index (DASI).Therefore, based on ACC/AHA guidelines, patient would be at acceptable risk for the planned procedure without further cardiovascular testing.  The patient was advised that if he develops new symptoms prior to surgery to contact our office to arrange for a follow-up visit, and he verbalized understanding.  A copy of this note will be routed to requesting surgeon.  Time:   Today, I have spent 5 minutes with the patient with telehealth technology discussing medical history, symptoms, and management plan.     Calvin JAYSON Braver, NP  06/13/2024, 9:27 AM

## 2024-06-15 NOTE — Progress Notes (Signed)
 Remote PPM Transmission

## 2024-08-08 ENCOUNTER — Ambulatory Visit (INDEPENDENT_AMBULATORY_CARE_PROVIDER_SITE_OTHER)

## 2024-08-08 DIAGNOSIS — I442 Atrioventricular block, complete: Secondary | ICD-10-CM | POA: Diagnosis not present

## 2024-08-08 LAB — CUP PACEART REMOTE DEVICE CHECK
Battery Remaining Longevity: 135 mo
Battery Voltage: 3.13 V
Brady Statistic AP VP Percent: 12.83 %
Brady Statistic AP VS Percent: 0 %
Brady Statistic AS VP Percent: 86.83 %
Brady Statistic AS VS Percent: 0.34 %
Brady Statistic RA Percent Paced: 12.82 %
Brady Statistic RV Percent Paced: 99.66 %
Date Time Interrogation Session: 20251116214832
Implantable Lead Connection Status: 753985
Implantable Lead Connection Status: 753985
Implantable Lead Implant Date: 20250402
Implantable Lead Implant Date: 20250402
Implantable Lead Location: 753859
Implantable Lead Location: 753860
Implantable Lead Model: 3830
Implantable Lead Model: 5076
Implantable Pulse Generator Implant Date: 20250402
Lead Channel Impedance Value: 304 Ohm
Lead Channel Impedance Value: 323 Ohm
Lead Channel Impedance Value: 437 Ohm
Lead Channel Impedance Value: 456 Ohm
Lead Channel Pacing Threshold Amplitude: 0.5 V
Lead Channel Pacing Threshold Amplitude: 1.25 V
Lead Channel Pacing Threshold Pulse Width: 0.4 ms
Lead Channel Pacing Threshold Pulse Width: 0.4 ms
Lead Channel Sensing Intrinsic Amplitude: 4.125 mV
Lead Channel Sensing Intrinsic Amplitude: 4.125 mV
Lead Channel Sensing Intrinsic Amplitude: 6.625 mV
Lead Channel Sensing Intrinsic Amplitude: 6.625 mV
Lead Channel Setting Pacing Amplitude: 1.5 V
Lead Channel Setting Pacing Amplitude: 2.5 V
Lead Channel Setting Pacing Pulse Width: 0.4 ms
Lead Channel Setting Sensing Sensitivity: 1.2 mV
Zone Setting Status: 755011
Zone Setting Status: 755011

## 2024-08-10 NOTE — Progress Notes (Signed)
 Remote PPM Transmission

## 2024-08-22 ENCOUNTER — Ambulatory Visit: Payer: Self-pay | Admitting: Cardiovascular Disease

## 2024-11-07 ENCOUNTER — Encounter

## 2025-02-06 ENCOUNTER — Encounter

## 2025-03-23 ENCOUNTER — Ambulatory Visit: Payer: Self-pay | Admitting: Pulmonary Disease
